# Patient Record
Sex: Male | Born: 1960
Health system: Southern US, Community
[De-identification: ages and names within clinical notes are randomized; demographics above are authoritative.]

## PROBLEM LIST (undated history)

## (undated) DIAGNOSIS — K219 Gastro-esophageal reflux disease without esophagitis: Secondary | ICD-10-CM

## (undated) DIAGNOSIS — M1990 Unspecified osteoarthritis, unspecified site: Secondary | ICD-10-CM

## (undated) DIAGNOSIS — Z9889 Other specified postprocedural states: Secondary | ICD-10-CM

## (undated) DIAGNOSIS — I10 Essential (primary) hypertension: Secondary | ICD-10-CM

## (undated) DIAGNOSIS — R112 Nausea with vomiting, unspecified: Secondary | ICD-10-CM

## (undated) DIAGNOSIS — R002 Palpitations: Secondary | ICD-10-CM

## (undated) HISTORY — DX: Unspecified osteoarthritis, unspecified site: M19.90

---

## 1982-11-19 HISTORY — PX: PILONIDAL CYST EXCISION: SHX744

## 2001-02-28 ENCOUNTER — Emergency Department (HOSPITAL_COMMUNITY): Admission: EM | Admit: 2001-02-28 | Discharge: 2001-02-28 | Payer: Self-pay | Admitting: Emergency Medicine

## 2001-02-28 ENCOUNTER — Encounter: Payer: Self-pay | Admitting: Emergency Medicine

## 2001-03-11 ENCOUNTER — Ambulatory Visit (HOSPITAL_COMMUNITY): Admission: RE | Admit: 2001-03-11 | Discharge: 2001-03-11 | Payer: Self-pay | Admitting: Gastroenterology

## 2007-05-29 ENCOUNTER — Encounter: Admission: RE | Admit: 2007-05-29 | Discharge: 2007-05-29 | Payer: Self-pay | Admitting: Otolaryngology

## 2010-03-19 HISTORY — PX: SHOULDER ARTHROSCOPY: SHX128

## 2010-03-19 HISTORY — PX: CYSTECTOMY: SUR359

## 2010-08-17 ENCOUNTER — Encounter: Admission: RE | Admit: 2010-08-17 | Discharge: 2010-08-17 | Payer: Self-pay | Admitting: Specialist

## 2010-08-17 ENCOUNTER — Emergency Department (HOSPITAL_COMMUNITY): Admission: EM | Admit: 2010-08-17 | Discharge: 2010-08-17 | Payer: Self-pay | Admitting: Emergency Medicine

## 2011-04-06 NOTE — Consult Note (Signed)
[place]. [facility]  Patient:    Alan Estrada, Alan Estrada                        MRN: [id number] Adm. Date:  [id number] Attending:  Doug Sou                          Consultation Report  HISTORY OF PRESENT ILLNESS:  Alan Estrada is a 50 year old gentleman who has been in previously good health.  He presents to the emergency room for palpitations.  The patient has been in relatively good health.  For the past year he has had intermittent episodes of palpitations.  These palpitations have worsened over the past several days and he was seen in [facility].  He was scheduled to see me in the office but presented to the emergency room last night for worsening palpitations.  These palpitations are described as single, isolated heart beat irregularities.  He typically will have many of them in a row and have between 15 and 30 minutes of palpitations.  He denies any episodes of chest pain, syncope, or presyncope with these episodes.  He denies any shortness of breath. These episodes typically occur when he is at rest.  They usually do not occur when he is busy and when he is active.  He has never had any problems doing his usual daily activities and he has not had any problems sleeping.  He has not changed his diet recently.  He has not had any heat or cold intolerance.  He has not had any diarrhea, excessive sweating.  He denies any syncope or presyncope, PND, or orthopnea.  He has not had any episodes of chest pain.  CURRENT MEDICATIONS:  None.  ALLERGIES:  None.  PAST MEDICAL HISTORY:  History of heme positive stool - seen by Alan Estrada.  SOCIAL HISTORY:  The patient drinks alcohol only rarely.  He does not smoke. 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  He does not get any regular exercise but stays fairly busy.  FAMILY HISTORY:  His father died of a ruptured abdominal aortic aneurysm.  His father was a heavy smoker.  REVIEW OF SYSTEMS:  Was reviewed.  It is  essentially negative except for as noted in the HPI.  PHYSICAL EXAMINATION:  GENERAL:  He is a young male in no acute distress.  He is alert and oriented x 3 and his mood and affect are normal.  VITAL SIGNS:  Blood pressure 116/92 with a heart rate of 90.  His respirations are 18.  HEENT:  Reveals 2+ carotids with no bruits.  There is no JVD.  There is no thyromegaly.  His neck is supple.  LUNGS:  Clear to auscultation.  BACK:  Nontender.  HEART:  Regular rate, S1, S2.  He has occasional premature beats.  There are no murmurs, gallops, or rubs.  His PMI is non-displaced.  ABDOMEN:  Reveals good bowel sounds.  He has no hepatosplenomegaly, no masses or bruits.  His abdomen is nontender.  EXTREMITIES:  He has no clubbing, cyanosis, or edema.  His pulses are intact. He has no calf tenderness.  NEUROLOGIC:  Reveals Cranial nerves 2-12 are intact and his motor and sensory function are intact.  His gait was not assessed.  GENITOURINARY:  Deferred.  LABORATORY DATA:  His EKG reveals normal sinus rhythm with rare premature ventricular beats.  Potassium 3.5.  Otherwise the laboratory data is  within normal limits.  IMPRESSION:  Alan Estrada presents with palpitations and is found to have PVCs. I suspect that his PVCs are due to excessive caffeine intact, a low potassium, and perhaps some sleep deficiencies.  We had a long discussion and I have asked him to drink or eat more potassium-containing foods, to try to get more sleep, and to try to reduce his caffeine intake.  I have reassured him that these PVCs are benign and should not cause him any significant problems.  He will see me in the office in a week or so for a follow-up visit.  We discussed the possibility of starting Inderal on an as-needed basis.  He stated he would just like to try the above-noted measures and did not want to start any new medications.  We will have a TSH added to his blood work. DD:  02/28/01 TD:   02/28/01 Job: [id number]

## 2011-04-06 NOTE — Procedures (Signed)
[place]. [facility]  Patient:    Alan Estrada, Alan Estrada                        MRN: [id number] Proc. Date: 03/12/01 Adm. Date:  [id number] Attending:  Charna Elizabeth CC:         Gabriel Earing, M.D.   Procedure Report  DATE OF BIRTH:  July 16, 1960  REFERRING PHYSICIAN:  Gabriel Earing, M.D.  PROCEDURE PERFORMED:  Colonoscopy.  ENDOSCOPIST:  Anselmo Rod, M.D.  INSTRUMENT USED:  Olympus video colonoscope.  INDICATIONS FOR PROCEDURE:  Rectal bleeding in a 50 year old white male rule out colonic polyps, masses, hemorrhoids, etc.  PREPROCEDURE PREPARATION:  Informed consent was procured from the patient. The patient was fasted for eight hours prior to the procedure and prepped with a bottle of magnesium citrate and a gallon of NuLytely the night prior to the procedure.  PREPROCEDURE PHYSICAL:  The patient had stable vital signs.  Neck supple. Chest clear to auscultation.  S1, S2 regular.  Abdomen soft with normal abdominal bowel sounds.  DESCRIPTION OF PROCEDURE:  The patient was placed in the left lateral decubitus position and sedated with 100 mg of Demerol and 10 mg of Versed intravenously.  Once the patient was adequately sedated and maintained on low-flow oxygen and continuous cardiac monitoring, the Olympus video colonoscope was advanced from the rectum to the cecum without difficulty. Except for small internal and external hemorrhoids, no other abnormalities were seen.  No masses, polyps, erosions, ulcerations or diverticula were present.  The procedure was complete up to the terminal ileum.  IMPRESSION:  Small internal and external hemorrhoids.  Otherwise normal-appearing colon.  RECOMMENDATIONS: 1. The patient has been advised to increase the fluid and fiber in the diet. 2. Outpatient follow-up is advised in the next two weeks.  Further    recommendation will be made at that time.DD:  03/12/01 TD:  03/12/01 Job: [id number]

## 2011-08-08 ENCOUNTER — Ambulatory Visit (INDEPENDENT_AMBULATORY_CARE_PROVIDER_SITE_OTHER): Payer: 59 | Admitting: Surgery

## 2011-08-08 ENCOUNTER — Encounter (INDEPENDENT_AMBULATORY_CARE_PROVIDER_SITE_OTHER): Payer: Self-pay | Admitting: Surgery

## 2011-08-08 VITALS — BP 130/90 | HR 64 | Temp 96.4°F | Resp 16 | Ht 75.0 in | Wt 259.6 lb

## 2011-08-08 DIAGNOSIS — L0591 Pilonidal cyst without abscess: Secondary | ICD-10-CM | POA: Insufficient documentation

## 2011-08-08 NOTE — Progress Notes (Signed)
NAME: Alan Estrada                                                                                      DOB: September 30, 1961 DATE: 08/08/2011               MRN: [id number]   CC:  Chief Complaint  Patient presents with  . Other    urgent- eval pilo cyst    HPI:  Alan Estrada is a 50 y.o.  male who presents with A pilonidal. He comes from his primary physician Dr.Brandon at [facility] on [address] Many years ago, when he was in the [employer], he had a pilonidal, and from his description it was operated on but was packed open to heal up. Is not clear whether he had an excision or simply had a drainage of a pilonidal abscess.  He has been having intermittent problems with drainage. It apparently has been draining spontaneously. He notes it develops a little bit of pressure and any drains purulent material. He has been treated a couple times with antibiotics. He is tired of this and wishes to consider having surgical intervention.  PMH:   has a past medical history of Arthritis.  PSH:   has past surgical history that includes Shoulder arthroscopy (03/2010); Cystectomy (03/2010); and Pilonidal cyst excision (1984).  ALLERGIES:  No Known Allergies  MEDICATIONS:   Current Outpatient Prescriptions  Medication Sig Dispense Refill  . Bioflavonoid Products (BIOFLEX PO) Take by mouth daily.        . IBUPROFEN PO Take by mouth as needed.        . Multiple Vitamin (MULTIVITAMIN) capsule Take 1 capsule by mouth daily.          ROS: His ROS is negative except for history of some hearing loss and ringing in his right ear and a history of palpitations which developed when he was taking some ephedrine to try to lose weight.  EXAM:   Gen.: The patient is alert oriented and generally healthy-appearing Back: In the lower midline in the pilonidal area is what appears to be a pair of pilonidal sinus tract openings in the midline. Above this and just to the left is a 1 cm slightly red area  that apparently is where these have been intermittently draining. There is no evidence of active infection today.  DATA REVIEWED:  I have reviewed the notes from his primary care physician IMPRESSION:  Pilonidal cyst and sinus  PLAN:   I've recommended that we do an elective excision of this area. I told him that all infection needs to be settle down so I think he needs to finish the course of antibiotics recently prescribed for him. I told him we would try to do this in about three weeks.  I told the plan would be to excise this area and try to do a primary closure but occasionally these areas had to be left open to heal from "inside out." He would like to go ahead and schedule surgery.

## 2011-08-08 NOTE — Patient Instructions (Signed)
We will schedule you for surgery to have the pilonidal area removed. This be done as an outpatient. You will have sutures in and will need to return to have those removed.  Call my office if you have any questions or concerns.  It is important that you finish the antibiotics that were recently prescribed. If you have any active infection at the time of surgery we will not be able to do a primary closure.

## 2011-09-03 ENCOUNTER — Other Ambulatory Visit (INDEPENDENT_AMBULATORY_CARE_PROVIDER_SITE_OTHER): Payer: Self-pay | Admitting: Surgery

## 2011-09-03 ENCOUNTER — Encounter (INDEPENDENT_AMBULATORY_CARE_PROVIDER_SITE_OTHER): Payer: Self-pay | Admitting: Surgery

## 2011-09-03 ENCOUNTER — Ambulatory Visit (HOSPITAL_BASED_OUTPATIENT_CLINIC_OR_DEPARTMENT_OTHER)
Admission: RE | Admit: 2011-09-03 | Discharge: 2011-09-03 | Disposition: A | Discharge status: Home / Self Care | Payer: 59 | Source: Ambulatory Visit | Attending: Surgery | Admitting: Surgery

## 2011-09-03 DIAGNOSIS — L0591 Pilonidal cyst without abscess: Secondary | ICD-10-CM

## 2011-09-03 DIAGNOSIS — K219 Gastro-esophageal reflux disease without esophagitis: Secondary | ICD-10-CM | POA: Insufficient documentation

## 2011-09-03 DIAGNOSIS — Z01812 Encounter for preprocedural laboratory examination: Secondary | ICD-10-CM | POA: Insufficient documentation

## 2011-09-03 HISTORY — PX: PILONIDAL CYST EXCISION: SHX744

## 2011-09-04 NOTE — Op Note (Signed)
  NAMESTEPEHN, ECKARD NO.:  [id number]  MEDICAL RECORD NO.:  [id number]  LOCATION:                                 FACILITY:  PHYSICIAN:  Currie Paris, M.D.   DATE OF BIRTH:  DATE OF PROCEDURE:  09/03/2011 DATE OF DISCHARGE:                              OPERATIVE REPORT   PREOPERATIVE DIAGNOSIS:  Pilonidal cyst/sinus.  POSTOPERATIVE DIAGNOSIS:  Pilonidal cyst/sinus.  PROCEDURE:  Excision pilonidal cyst and sinus.  SURGEON:  Currie Paris, MD  ASSISTANT:  Jaclynn Major, PA student.  ANESTHESIA:  General.  CLINICAL HISTORY:  This is a 50 year old gentleman who has had a chronic pilonidal with intermittent episodes of inflammation.  He had it either excised or drained many years ago when he was in [employer], but has had recurrence.  He elected at this point to proceed to excision.  DESCRIPTION OF PROCEDURE:  I saw the patient in the preoperative area and he had no further questions.  We confirmed the plans for the procedure as noted above with risk complications had been noted previously.  He had no further questions.  We noted that we may be able to close this primarily or might have to be left open to heal by secondary intention.  The patient was then taken to the operating room, and after satisfactory general anesthesia, he was placed in the operating room table in the prone position with appropriate padding.  The area of the pilonidal was clipped, prepped, and draped.  The time-out was done.  I injected the pilonidal with little methylene blue to try to make sure we did not leave any of the tract behind.  I made an elliptical incision around it, deepened it to the fascia, and did a full-thickness excision of skin subcutaneous tissue and fascia.  We did not see that we had crossed any blue dye in any place.  I elevated the full-thickness off of the fascia little bit to take some of the tension off the closure.  I put 20 mL of  Marcaine in to help with postop pain relief.  I have used some Vicryl to close the fascia and then a combination some 2-0 nylon and 4-0 Prolene vertical mattresses to close the skin so that we had closed approximation.  Everything appeared to be dry.  I held about 5 minutes of pressure just to be sure there will be no bleeding and then put some Dermabond on the incision. The patient tolerated the procedure well.  There were no complications. All counts were correct.     Currie Paris, M.D.     CJS/MEDQ  D:  09/03/2011  T:  09/03/2011  Job:  [id number]  cc:   Dr. Christella Hartigan  Electronically Signed by Cyndia Bent M.D. on 09/04/2011 05:37:45 AM

## 2011-09-11 ENCOUNTER — Encounter (INDEPENDENT_AMBULATORY_CARE_PROVIDER_SITE_OTHER): Payer: Self-pay | Admitting: Surgery

## 2011-09-11 ENCOUNTER — Ambulatory Visit (INDEPENDENT_AMBULATORY_CARE_PROVIDER_SITE_OTHER): Payer: 59 | Admitting: Surgery

## 2011-09-11 VITALS — BP 118/92 | HR 64 | Temp 97.5°F | Resp 20 | Ht 75.0 in | Wt 255.1 lb

## 2011-09-11 DIAGNOSIS — L0591 Pilonidal cyst without abscess: Secondary | ICD-10-CM

## 2011-09-11 NOTE — Patient Instructions (Signed)
I will plan to see you back next week. Call if there are any problems

## 2011-09-11 NOTE — Progress Notes (Signed)
  CC: Followup after her pilonidal excision with primary closure  HPI: This patient comes in for post op follow-up. He underwent excision of pilonidal on 09/03/11. He feels that he is doing well.  PE: General: The patient appears to be healthy,  Surgical site: Seems to be healing nicely with no evidence of infection. I removed a couple of the sutures but left the remaining once behind.  DATA REVIEWED: Pathology report was noted and discussed with the patient   IMPRESSION: The patient is doing well S/P pilonidal excision with primary closure.    PLAN: Recheck next week for likely suture removal

## 2011-09-20 ENCOUNTER — Encounter (INDEPENDENT_AMBULATORY_CARE_PROVIDER_SITE_OTHER): Payer: Self-pay | Admitting: Surgery

## 2011-09-20 ENCOUNTER — Ambulatory Visit (INDEPENDENT_AMBULATORY_CARE_PROVIDER_SITE_OTHER): Payer: 59 | Admitting: Surgery

## 2011-09-20 VITALS — BP 136/90 | HR 84 | Temp 97.4°F | Resp 16 | Ht 75.0 in | Wt 258.0 lb

## 2011-09-20 DIAGNOSIS — L0591 Pilonidal cyst without abscess: Secondary | ICD-10-CM

## 2011-09-20 NOTE — Patient Instructions (Signed)
You may return to work on Monday. I will be seen next week to get the remaining sutures out. Call if there are any problems in the meantime.

## 2011-09-20 NOTE — Progress Notes (Signed)
  CC: Followup after her pilonidal excision with primary closure  HPI: This patient comes in for post op follow-up. He underwent excision of pilonidal on 09/03/11. He feels that he is doing well.He had some bloody drainage after we removed some sutures last week  PE: General: The patient appears to be healthy,  Surgical site: Seems to be healing nicely with no evidence of infection. I removed a most of the sutures but left a few in the mid portion.    IMPRESSION: The patient is doing well S/P pilonidal excision with primary closure.    PLAN: Recheck next week for suture removal

## 2011-09-28 ENCOUNTER — Encounter (INDEPENDENT_AMBULATORY_CARE_PROVIDER_SITE_OTHER): Payer: Self-pay | Admitting: Surgery

## 2011-09-28 ENCOUNTER — Ambulatory Visit (INDEPENDENT_AMBULATORY_CARE_PROVIDER_SITE_OTHER): Payer: 59 | Admitting: Surgery

## 2011-09-28 VITALS — BP 116/82 | HR 80 | Temp 97.9°F | Resp 20 | Ht 75.0 in | Wt 206.0 lb

## 2011-09-28 DIAGNOSIS — Z9889 Other specified postprocedural states: Secondary | ICD-10-CM

## 2011-09-28 NOTE — Progress Notes (Signed)
  CC: Followup after his pilonidal excision with primary closure  HPI: This patient comes in for post op follow-up. He underwent excision of pilonidal on 09/03/11. He feels that he is doing well.  PE: General: The patient appears to be healthy,  Surgical site: Seems to be healing nicely with no evidence of infection. I removed the final sutures. No evidence of infection, appears completely healed    IMPRESSION: The patient is doing well S/P pilonidal excision with primary closure.    PLAN: RTC PRN

## 2011-11-20 HISTORY — PX: CARPAL TUNNEL RELEASE: SHX101

## 2012-01-01 ENCOUNTER — Other Ambulatory Visit: Payer: Self-pay | Admitting: Neurosurgery

## 2012-01-01 DIAGNOSIS — M545 Low back pain: Secondary | ICD-10-CM

## 2012-01-05 ENCOUNTER — Ambulatory Visit
Admission: RE | Admit: 2012-01-05 | Discharge: 2012-01-05 | Disposition: A | Discharge status: Home / Self Care | Payer: 59 | Source: Ambulatory Visit | Attending: Neurosurgery | Admitting: Neurosurgery

## 2012-01-05 DIAGNOSIS — M545 Low back pain: Secondary | ICD-10-CM

## 2012-11-19 HISTORY — PX: ELBOW ARTHROSCOPY: SUR87

## 2013-08-19 DIAGNOSIS — R002 Palpitations: Secondary | ICD-10-CM

## 2013-08-19 HISTORY — DX: Palpitations: R00.2

## 2014-06-12 ENCOUNTER — Emergency Department (HOSPITAL_COMMUNITY)
Admission: EM | Admit: 2014-06-12 | Discharge: 2014-06-12 | Disposition: A | Discharge status: Home / Self Care | Payer: 59 | Attending: Emergency Medicine | Admitting: Emergency Medicine

## 2014-06-12 ENCOUNTER — Encounter (HOSPITAL_COMMUNITY): Payer: Self-pay | Admitting: Emergency Medicine

## 2014-06-12 DIAGNOSIS — Z8739 Personal history of other diseases of the musculoskeletal system and connective tissue: Secondary | ICD-10-CM | POA: Insufficient documentation

## 2014-06-12 DIAGNOSIS — Z79899 Other long term (current) drug therapy: Secondary | ICD-10-CM | POA: Diagnosis not present

## 2014-06-12 DIAGNOSIS — Z791 Long term (current) use of non-steroidal anti-inflammatories (NSAID): Secondary | ICD-10-CM | POA: Diagnosis not present

## 2014-06-12 DIAGNOSIS — R110 Nausea: Secondary | ICD-10-CM | POA: Diagnosis not present

## 2014-06-12 DIAGNOSIS — R1032 Left lower quadrant pain: Secondary | ICD-10-CM | POA: Insufficient documentation

## 2014-06-12 DIAGNOSIS — R319 Hematuria, unspecified: Secondary | ICD-10-CM | POA: Insufficient documentation

## 2014-06-12 DIAGNOSIS — R6883 Chills (without fever): Secondary | ICD-10-CM | POA: Diagnosis not present

## 2014-06-12 DIAGNOSIS — Z906 Acquired absence of other parts of urinary tract: Secondary | ICD-10-CM | POA: Diagnosis not present

## 2014-06-12 LAB — CBC WITH DIFFERENTIAL/PLATELET
BASOS ABS: 0 10*3/uL (ref 0.0–0.1)
BASOS PCT: 1 % (ref 0–1)
Eosinophils Absolute: 0.3 10*3/uL (ref 0.0–0.7)
Eosinophils Relative: 7 % — ABNORMAL HIGH (ref 0–5)
HCT: 42.5 % (ref 39.0–52.0)
HEMOGLOBIN: 15.1 g/dL (ref 13.0–17.0)
LYMPHS PCT: 33 % (ref 12–46)
Lymphs Abs: 1.3 10*3/uL (ref 0.7–4.0)
MCH: 30.8 pg (ref 26.0–34.0)
MCHC: 35.5 g/dL (ref 30.0–36.0)
MCV: 86.6 fL (ref 78.0–100.0)
MONOS PCT: 6 % (ref 3–12)
Monocytes Absolute: 0.2 10*3/uL (ref 0.1–1.0)
NEUTROS ABS: 2.1 10*3/uL (ref 1.7–7.7)
NEUTROS PCT: 53 % (ref 43–77)
Platelets: 153 10*3/uL (ref 150–400)
RBC: 4.91 MIL/uL (ref 4.22–5.81)
RDW: 12.4 % (ref 11.5–15.5)
WBC: 4 10*3/uL (ref 4.0–10.5)

## 2014-06-12 LAB — COMPREHENSIVE METABOLIC PANEL
ALBUMIN: 4 g/dL (ref 3.5–5.2)
ALK PHOS: 87 U/L (ref 39–117)
ALT: 28 U/L (ref 0–53)
AST: 24 U/L (ref 0–37)
Anion gap: 16 — ABNORMAL HIGH (ref 5–15)
BILIRUBIN TOTAL: 0.5 mg/dL (ref 0.3–1.2)
BUN: 20 mg/dL (ref 6–23)
CHLORIDE: 104 meq/L (ref 96–112)
CO2: 23 mEq/L (ref 19–32)
Calcium: 8.9 mg/dL (ref 8.4–10.5)
Creatinine, Ser: 0.73 mg/dL (ref 0.50–1.35)
GFR calc Af Amer: >90 mL/min (ref >90)
GFR calc non Af Amer: >90 mL/min (ref >90)
Glucose, Bld: 120 mg/dL — ABNORMAL HIGH (ref 70–99)
POTASSIUM: 3.7 meq/L (ref 3.7–5.3)
Sodium: 143 mEq/L (ref 137–147)
Total Protein: 6.9 g/dL (ref 6.0–8.3)

## 2014-06-12 LAB — URINALYSIS, ROUTINE W REFLEX MICROSCOPIC
Bilirubin Urine: NEGATIVE
Glucose, UA: NEGATIVE mg/dL
KETONES UR: NEGATIVE mg/dL
LEUKOCYTES UA: NEGATIVE
NITRITE: NEGATIVE
PH: 6 (ref 5.0–8.0)
Protein, ur: NEGATIVE mg/dL
SPECIFIC GRAVITY, URINE: 1.013 (ref 1.005–1.030)
Urobilinogen, UA: 0.2 mg/dL (ref 0.0–1.0)

## 2014-06-12 LAB — URINE MICROSCOPIC-ADD ON

## 2014-06-12 MED ORDER — HYDROCODONE-ACETAMINOPHEN 5-325 MG PO TABS: 1.0000 | ORAL_TABLET | ORAL | Status: DC | PRN

## 2014-06-12 NOTE — ED Notes (Signed)
Pt given water to drink. 

## 2014-06-12 NOTE — ED Notes (Signed)
Dr. Wickline at bedside.  

## 2014-06-12 NOTE — ED Provider Notes (Signed)
CSN: [id number]     Arrival date & time 06/12/14  4098 History   First MD Initiated Contact with Patient 06/12/14 [phone]     Chief Complaint  Patient presents with  . Groin Pain     Patient is a 53 y.o. male presenting with abdominal pain. The history is provided by the patient and a significant other.  Abdominal Pain Pain location:  LLQ Pain quality: sharp   Pain severity:  Moderate Onset quality:  Sudden Duration:  2 hours Timing:  Constant Progression:  Improving Chronicity:  New Context: awakening from sleep   Relieved by:  None tried Worsened by:  Nothing tried Associated symptoms: chills and nausea   Associated symptoms: no chest pain, no dysuria, no fever, no shortness of breath and no vomiting   Pt reports after waking up this morning he had pain in LLQ that radiated into left upper abdomen/flank  He has never had this before He had otherwise been well He is now improving He denies known h/o kidney stones  Past Medical History  Diagnosis Date  . Arthritis    Past Surgical History  Procedure Laterality Date  . Shoulder arthroscopy  03/2010    right  . Cystectomy  03/2010    from inside right shoulder  . Pilonidal cyst excision  1984  . Pilonidal cyst excision  09/03/2011    Dr Jamey Ripa   No family history on file. History  Substance Use Topics  . Smoking status: Never Smoker   . Smokeless tobacco: Never Used  . Alcohol Use: Yes    Review of Systems  Constitutional: Positive for chills. Negative for fever.  Respiratory: Negative for shortness of breath.   Cardiovascular: Negative for chest pain.  Gastrointestinal: Positive for nausea and abdominal pain. Negative for vomiting.  Genitourinary: Negative for dysuria and testicular pain.  Neurological: Negative for weakness.  All other systems reviewed and are negative.     Allergies  Review of patient's allergies indicates no known allergies.  Home Medications   Prior to Admission medications    Medication Sig Start Date End Date Taking? Authorizing Provider  DiphenhydrAMINE HCl (ALLERGY MED PO) Take 1 tablet by mouth daily.   Yes Historical Provider, MD  Glucosamine-Chondroitin (GLUCOSAMINE CHONDR COMPLEX PO) Take 1 oz by mouth daily.   Yes Historical Provider, MD  Multiple Vitamin (MULTIVITAMIN) capsule Take 1 capsule by mouth daily.     Yes Historical Provider, MD  naproxen sodium (ANAPROX) 220 MG tablet Take 400 mg by mouth every morning.   Yes Historical Provider, MD  Omega-3 Fatty Acids (FISH OIL PO) Take 1 capsule by mouth daily.   Yes Historical Provider, MD   BP 120/89  Pulse 62  Temp(Src) 97.6 F (36.4 C) (Oral)  Resp 11  Ht 6\' 3"  (1.905 m)  Wt 255 lb (115.667 kg)  BMI 31.87 kg/m2  SpO2 93% Physical Exam CONSTITUTIONAL: Well developed/well nourished HEAD: Normocephalic/atraumatic EYES: EOMI/PERRL ENMT: Mucous membranes moist NECK: supple no meningeal signs SPINE:entire spine nontender CV: S1/S2 noted, no murmurs/rubs/gallops noted LUNGS: Lungs are clear to auscultation bilaterally, no apparent distress ABDOMEN: soft, nontender, no rebound or guarding GU:no cva tenderness. No tenderness to inguinal region NEURO: Pt is awake/alert, moves all extremitiesx4 EXTREMITIES: pulses normal, full ROM SKIN: warm, color normal PSYCH: no abnormalities of mood noted  ED Course  Procedures   8:02 AM Pt with sudden onset of LLQ pain, now improving He is well appearing Will check urinalysis and reassess He has no groin/testicular pain  at this time Suspect ureteral colic  Pt improved He is in no distress  given history of sharp pain with hematuria that is now resolving, suspect ureteral colic I doubt AAA/diverticulitis or other acute abdominal/vascular process I had long discussion with patient/family.  I did offer imaging to evaluate for stones.  After discussion, pt prefers to go home and f/u as outpatient.  I think this is reasonable.  He was given pain meds and we  discussed strict return precautions  Labs Review Labs Reviewed  URINALYSIS, ROUTINE W REFLEX MICROSCOPIC - Abnormal; Notable for the following:    APPearance CLOUDY (*)    Hgb urine dipstick LARGE (*)    All other components within normal limits  CBC WITH DIFFERENTIAL - Abnormal; Notable for the following:    Eosinophils Relative 7 (*)    All other components within normal limits  COMPREHENSIVE METABOLIC PANEL - Abnormal; Notable for the following:    Glucose, Bld 120 (*)    Anion gap 16 (*)    All other components within normal limits  URINE MICROSCOPIC-ADD ON     MDM   Final diagnoses:  Left lower quadrant pain  Hematuria    Nursing notes including past medical history and social history reviewed and considered in documentation Labs/vital reviewed and considered     Joya Gaskinsonald W Jennise Both, MD 06/12/14 1009

## 2014-06-12 NOTE — ED Notes (Signed)
Pt. reports left groin pain radiating to LLQ onset this morning , denies injury , no urinary discomfort , denies nausea or vomitting , no fever.

## 2014-06-12 NOTE — ED Notes (Signed)
VSS stable. NAD noted. Pt c/o 1/10 pain. Pt given discharge instructions and prescriptions were reviewed. Al questions answered. Pt ambulatory on discharge

## 2014-12-24 ENCOUNTER — Emergency Department (HOSPITAL_COMMUNITY): Payer: Worker's Compensation

## 2014-12-24 ENCOUNTER — Inpatient Hospital Stay (HOSPITAL_COMMUNITY)
Admission: EM | Admit: 2014-12-24 | Discharge: 2014-12-26 | DRG: 087 | Disposition: A | Discharge status: Home / Self Care | Payer: Worker's Compensation | Attending: General Surgery | Admitting: General Surgery

## 2014-12-24 ENCOUNTER — Encounter (HOSPITAL_COMMUNITY): Payer: Self-pay | Admitting: Emergency Medicine

## 2014-12-24 DIAGNOSIS — S020XXA Fracture of vault of skull, initial encounter for closed fracture: Principal | ICD-10-CM | POA: Diagnosis present

## 2014-12-24 DIAGNOSIS — S02401A Maxillary fracture, unspecified, initial encounter for closed fracture: Secondary | ICD-10-CM | POA: Insufficient documentation

## 2014-12-24 DIAGNOSIS — S06890A Other specified intracranial injury without loss of consciousness, initial encounter: Secondary | ICD-10-CM | POA: Diagnosis present

## 2014-12-24 DIAGNOSIS — W208XXA Other cause of strike by thrown, projected or falling object, initial encounter: Secondary | ICD-10-CM | POA: Diagnosis present

## 2014-12-24 DIAGNOSIS — S0292XA Unspecified fracture of facial bones, initial encounter for closed fracture: Secondary | ICD-10-CM | POA: Diagnosis present

## 2014-12-24 DIAGNOSIS — G9389 Other specified disorders of brain: Secondary | ICD-10-CM | POA: Diagnosis present

## 2014-12-24 DIAGNOSIS — M1990 Unspecified osteoarthritis, unspecified site: Secondary | ICD-10-CM | POA: Diagnosis present

## 2014-12-24 DIAGNOSIS — S028XXA Fractures of other specified skull and facial bones, initial encounter for closed fracture: Secondary | ICD-10-CM | POA: Diagnosis present

## 2014-12-24 DIAGNOSIS — R002 Palpitations: Secondary | ICD-10-CM | POA: Diagnosis present

## 2014-12-24 DIAGNOSIS — S0219XA Other fracture of base of skull, initial encounter for closed fracture: Secondary | ICD-10-CM

## 2014-12-24 DIAGNOSIS — Y990 Civilian activity done for income or pay: Secondary | ICD-10-CM

## 2014-12-24 DIAGNOSIS — S02413A LeFort III fracture, initial encounter for closed fracture: Secondary | ICD-10-CM | POA: Diagnosis present

## 2014-12-24 DIAGNOSIS — R110 Nausea: Secondary | ICD-10-CM | POA: Diagnosis present

## 2014-12-24 DIAGNOSIS — S02412A LeFort II fracture, initial encounter for closed fracture: Secondary | ICD-10-CM | POA: Diagnosis present

## 2014-12-24 HISTORY — DX: Palpitations: R00.2

## 2014-12-24 LAB — BASIC METABOLIC PANEL
Anion gap: 7 (ref 5–15)
BUN: 10 mg/dL (ref 6–23)
CALCIUM: 9 mg/dL (ref 8.4–10.5)
CO2: 26 mmol/L (ref 19–32)
CREATININE: 0.82 mg/dL (ref 0.50–1.35)
Chloride: 105 mmol/L (ref 96–112)
GFR calc Af Amer: >90 mL/min (ref >90)
GFR calc non Af Amer: >90 mL/min (ref >90)
GLUCOSE: 132 mg/dL — AB (ref 70–99)
POTASSIUM: 3.5 mmol/L (ref 3.5–5.1)
SODIUM: 138 mmol/L (ref 135–145)

## 2014-12-24 LAB — TYPE AND SCREEN
ABO/RH(D): B POS
ANTIBODY SCREEN: NEGATIVE

## 2014-12-24 LAB — CBC WITH DIFFERENTIAL/PLATELET
BASOS ABS: 0 10*3/uL (ref 0.0–0.1)
BASOS PCT: 0 % (ref 0–1)
EOS ABS: 0.1 10*3/uL (ref 0.0–0.7)
Eosinophils Relative: 2 % (ref 0–5)
HCT: 40.1 % (ref 39.0–52.0)
Hemoglobin: 14.3 g/dL (ref 13.0–17.0)
LYMPHS ABS: 1.1 10*3/uL (ref 0.7–4.0)
LYMPHS PCT: 13 % (ref 12–46)
MCH: 30.7 pg (ref 26.0–34.0)
MCHC: 35.7 g/dL (ref 30.0–36.0)
MCV: 86.1 fL (ref 78.0–100.0)
MONO ABS: 0.5 10*3/uL (ref 0.1–1.0)
Monocytes Relative: 6 % (ref 3–12)
NEUTROS ABS: 6.8 10*3/uL (ref 1.7–7.7)
NEUTROS PCT: 79 % — AB (ref 43–77)
PLATELETS: 157 10*3/uL (ref 150–400)
RBC: 4.66 MIL/uL (ref 4.22–5.81)
RDW: 12.9 % (ref 11.5–15.5)
WBC: 8.6 10*3/uL (ref 4.0–10.5)

## 2014-12-24 LAB — ABO/RH: ABO/RH(D): B POS

## 2014-12-24 LAB — ETHANOL: Alcohol, Ethyl (B): <5 mg/dL (ref 0–9)

## 2014-12-24 MED ORDER — PANTOPRAZOLE SODIUM 40 MG PO TBEC
40.0000 mg | DELAYED_RELEASE_TABLET | Freq: Every day | ORAL | Status: DC
  Administered 2014-12-25 – 2014-12-26 (×2): 40 mg via ORAL
  Filled 2014-12-24 (×3): qty 1

## 2014-12-24 MED ORDER — ONDANSETRON HCL 4 MG/2ML IJ SOLN
4.0000 mg | INTRAMUSCULAR | Status: DC
  Administered 2014-12-25: 4 mg via INTRAVENOUS
  Filled 2014-12-24: qty 2

## 2014-12-24 MED ORDER — ONDANSETRON HCL 4 MG/2ML IJ SOLN
4.0000 mg | Freq: Once | INTRAMUSCULAR | Status: AC
  Administered 2014-12-24: 4 mg via INTRAVENOUS

## 2014-12-24 MED ORDER — PROMETHAZINE HCL 25 MG/ML IJ SOLN
12.5000 mg | Freq: Once | INTRAMUSCULAR | Status: AC
  Administered 2014-12-24: 12.5 mg via INTRAVENOUS
  Filled 2014-12-24: qty 1

## 2014-12-24 MED ORDER — ONDANSETRON HCL 4 MG PO TABS: 4.0000 mg | ORAL_TABLET | ORAL | Status: DC

## 2014-12-24 MED ORDER — ONDANSETRON HCL 4 MG/2ML IJ SOLN: 4.0000 mg | INTRAMUSCULAR | Status: DC

## 2014-12-24 MED ORDER — DOCUSATE SODIUM 100 MG PO CAPS
100.0000 mg | ORAL_CAPSULE | Freq: Two times a day (BID) | ORAL | Status: DC
  Administered 2014-12-24 – 2014-12-26 (×5): 100 mg via ORAL
  Filled 2014-12-24 (×5): qty 1

## 2014-12-24 MED ORDER — ONDANSETRON HCL 4 MG/2ML IJ SOLN: 4.0000 mg | Freq: Once | INTRAMUSCULAR | Status: DC

## 2014-12-24 MED ORDER — ONDANSETRON HCL 4 MG PO TABS
4.0000 mg | ORAL_TABLET | ORAL | Status: DC
  Administered 2014-12-24 – 2014-12-26 (×6): 4 mg via ORAL
  Filled 2014-12-24 (×16): qty 1

## 2014-12-24 MED ORDER — ONDANSETRON HCL 4 MG/2ML IJ SOLN
4.0000 mg | Freq: Once | INTRAMUSCULAR | Status: AC
  Administered 2014-12-24: 4 mg via INTRAVENOUS
  Filled 2014-12-24: qty 2

## 2014-12-24 MED ORDER — CHLORPHENIRAMINE MALEATE 4 MG PO TABS: 4.0000 mg | ORAL_TABLET | Freq: Four times a day (QID) | ORAL | Status: DC | PRN

## 2014-12-24 MED ORDER — HYDROMORPHONE HCL 1 MG/ML IJ SOLN
1.0000 mg | Freq: Once | INTRAMUSCULAR | Status: AC
  Administered 2014-12-24: 1 mg via INTRAVENOUS
  Filled 2014-12-24: qty 1

## 2014-12-24 MED ORDER — MORPHINE SULFATE 2 MG/ML IJ SOLN
2.0000 mg | INTRAMUSCULAR | Status: DC | PRN
  Administered 2014-12-24 (×3): 2 mg via INTRAVENOUS
  Filled 2014-12-24 (×3): qty 1

## 2014-12-24 MED ORDER — DIPHENHYDRAMINE HCL 25 MG PO CAPS
25.0000 mg | ORAL_CAPSULE | Freq: Four times a day (QID) | ORAL | Status: DC | PRN
  Administered 2014-12-25 – 2014-12-26 (×4): 25 mg via ORAL
  Filled 2014-12-24 (×4): qty 1

## 2014-12-24 MED ORDER — POTASSIUM CHLORIDE IN NACL 20-0.45 MEQ/L-% IV SOLN
INTRAVENOUS | Status: DC
  Administered 2014-12-24 – 2014-12-25 (×2): via INTRAVENOUS
  Administered 2014-12-26: 1000 mL via INTRAVENOUS
  Filled 2014-12-24 (×4): qty 1000

## 2014-12-24 MED ORDER — HYDROCODONE-ACETAMINOPHEN 10-325 MG PO TABS
0.5000 | ORAL_TABLET | ORAL | Status: DC | PRN
  Administered 2014-12-24: 1 via ORAL
  Administered 2014-12-24 – 2014-12-26 (×11): 2 via ORAL
  Filled 2014-12-24: qty 1
  Filled 2014-12-24 (×12): qty 2

## 2014-12-24 MED ORDER — PROMETHAZINE HCL 25 MG/ML IJ SOLN
12.5000 mg | INTRAMUSCULAR | Status: DC | PRN
  Administered 2014-12-26: 12.5 mg via INTRAVENOUS
  Filled 2014-12-24 (×2): qty 1

## 2014-12-24 MED ORDER — ENOXAPARIN SODIUM 40 MG/0.4ML ~~LOC~~ SOLN
40.0000 mg | SUBCUTANEOUS | Status: DC
  Administered 2014-12-24 – 2014-12-26 (×3): 40 mg via SUBCUTANEOUS
  Filled 2014-12-24 (×5): qty 0.4

## 2014-12-24 MED ORDER — FENTANYL CITRATE 0.05 MG/ML IJ SOLN
100.0000 ug | Freq: Once | INTRAMUSCULAR | Status: AC
  Administered 2014-12-24: 100 ug via INTRAVENOUS
  Filled 2014-12-24: qty 2

## 2014-12-24 MED ORDER — PANTOPRAZOLE SODIUM 40 MG IV SOLR
40.0000 mg | Freq: Every day | INTRAVENOUS | Status: DC
  Administered 2014-12-24: 40 mg via INTRAVENOUS
  Filled 2014-12-24 (×3): qty 40
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
## 2014-12-24 NOTE — ED Notes (Signed)
PT vomitted approx of blood. States that he is still feeling nauseated. Pt rt nare noted to have some new blood. Gauze applied. Bleeding controled at this time. Trauma PA notified and nausea and pain medications requested.

## 2014-12-24 NOTE — ED Provider Notes (Signed)
CSN: [id number]     Arrival date & time 12/24/14  0450 History   First MD Initiated Contact with Patient 12/24/14 [phone]     Chief Complaint  Patient presents with  . Facial Laceration  . Bleeding/Bruising     Patient is a 54 y.o. male presenting with facial injury. The history is provided by the patient.  Facial Injury Mechanism of injury:  Direct blow Location:  Forehead and nose Time since incident: over 1 hr ago. Pain details:    Severity:  Severe   Timing:  Constant   Progression:  Worsening Chronicity:  New Relieved by: IV fentanyl. Worsened by:  Movement Associated symptoms: epistaxis, headaches and nausea   Associated symptoms: no loss of consciousness and no neck pain   Patient works for [employer] He was working on [employer] and he was cutting tree limbs when one of them hit him in the face No LOC He was ground level No electrocution reported He feels his teeth are not aligned  No neck/back pain No cp/abdominal pain  Tetanus is UTD  Past Medical History  Diagnosis Date  . Arthritis   . Palpitations    Past Surgical History  Procedure Laterality Date  . Shoulder arthroscopy  03/2010    right  . Cystectomy  03/2010    from inside right shoulder  . Pilonidal cyst excision  1984  . Pilonidal cyst excision  09/03/2011    Dr Jamey Ripa   No family history on file. History  Substance Use Topics  . Smoking status: Never Smoker   . Smokeless tobacco: Never Used  . Alcohol Use: Yes    Review of Systems  Constitutional: Negative for fever.  HENT: Positive for nosebleeds.   Cardiovascular: Negative for chest pain.  Gastrointestinal: Positive for nausea.  Musculoskeletal: Negative for back pain and neck pain.  Neurological: Positive for headaches. Negative for loss of consciousness.  All other systems reviewed and are negative.     Allergies  Review of patient's allergies indicates no known allergies.  Home Medications   Prior to Admission  medications   Medication Sig Start Date End Date Taking? Authorizing Provider  HYDROcodone-acetaminophen (NORCO/VICODIN) 5-325 MG per tablet Take 1 tablet by mouth every 4 (four) hours as needed for moderate pain or severe pain. 06/12/14  Yes Joya Gaskins, MD  DiphenhydrAMINE HCl (ALLERGY MED PO) Take 1 tablet by mouth daily.    Historical Provider, MD  Glucosamine-Chondroitin (GLUCOSAMINE CHONDR COMPLEX PO) Take 1 oz by mouth daily.    Historical Provider, MD  Multiple Vitamin (MULTIVITAMIN) capsule Take 1 capsule by mouth daily.      Historical Provider, MD  naproxen sodium (ANAPROX) 220 MG tablet Take 400 mg by mouth every morning.    Historical Provider, MD  Omega-3 Fatty Acids (FISH OIL PO) Take 1 capsule by mouth daily.    Historical Provider, MD   BP 126/80 mmHg  Pulse 60  Temp(Src) 98.2 F (36.8 C) (Oral)  Resp 15  Ht  (1.905 m)  Wt 255 lb (115.667 kg)  BMI 31.87 kg/m2  SpO2 97% Physical Exam CONSTITUTIONAL: Well developed/well nourished HEAD: bruising dried blood to forehead.  No other signs of head trauma EYES: +EOMI, right eyelid is edematous and bruised.  OD - globe appears intact, no erythema, pupil responds to light, no subconjunctival hemorrhage.  OS is without bruising or evidence of trauma ENMT: Mucous membranes moist.  No dental fracture noted.  Dried blood to nose with laceration to  bridge of nose.  No obvious septal hematoma.   NECK: supple no meningeal signs SPINE/BACK:entire spine nontender, No bruising/crepitance/stepoffs noted to spine CV: S1/S2 noted, no murmurs/rubs/gallops noted LUNGS: Lungs are clear to auscultation bilaterally, no apparent distress ABDOMEN: soft, nontender, no rebound or guarding, bowel sounds noted throughout abdomen GU:no cva tenderness NEURO: Pt is awake/alert/appropriate, moves all extremitiesx4.  GCS 15 EXTREMITIES: pulses normal/equal, full ROM, All extremities/joints palpated/ranged and nontender SKIN: warm, color  normal PSYCH: no abnormalities of mood noted, alert and oriented to situation  ED Course  Procedures  CRITICAL CARE Performed by: Joya GaskinsWICKLINE,Vonceil Upshur W Total critical care time: 31 Critical care time was exclusive of separately billable procedures and treating other patients. Critical care was necessary to treat or prevent imminent or life-threatening deterioration. Critical care was time spent personally by me on the following activities: development of treatment plan with patient and/or surrogate as well as nursing, discussions with consultants, evaluation of patient's response to treatment, examination of patient, obtaining history from patient or surrogate, ordering and performing treatments and interventions, ordering and review of laboratory studies, ordering and review of radiographic studies, pulse oximetry and re-evaluation of patient's condition. Patient with significant facial/frontal sinus fracture as well pneumocephalus  5:29 AM CT imaging ordered Pt denies any visual disturbance 6:18 AM D/w radiology Patient has frontal sinus fracture.  He has pneumocephalus He also has lefort facial fractures Will consult trauma/neurosurgery/ENT 6:34 AM Pt is awake/alert, GCS 15.  He is protecting his airway D/w dr Gerlene Feekritzer with nsgy he will review imaging D/w dr Alan Ripperclaire sanger for facial trauma, she will review CT imaging Consult to trauma 6:40 AM D/w dr Janee Mornthompson with trauma, will evaluate patient 7:43 AM After further evaluation, given mechanism will order CT cspine Pt stable in ED Will be admitted to trauma  Imaging Review Ct Head Wo Contrast  12/24/2014   CLINICAL DATA:  Head injury. Hit in face by tree limb. Bruising to both eyes.  EXAM: CT HEAD WITHOUT CONTRAST  CT MAXILLOFACIAL WITHOUT CONTRAST  TECHNIQUE: Multidetector CT imaging of the head and maxillofacial structures were performed using the standard protocol without intravenous contrast. Multiplanar CT image reconstructions of  the maxillofacial structures were also generated.  COMPARISON:  None.  FINDINGS: CT HEAD FINDINGS  There are small foci of pneumocephalus anteriorly related to right frontal bone fracture. This is better characterized on concurrently performed CT face. There is no associated intracranial hemorrhage. No mass effect or midline shift. No hydrocephalus. The basilar cisterns are patent. No evidence of territorial infarct. No intracranial fluid collection.  CT MAXILLOFACIAL FINDINGS  There extensive facial bone fractures with involvement of both pterygoid plates consistent with bilateral LeFort injuries. Oblique mildly displaced fracture through the frontal sinus extends to involve the right frontal bone and associated foci of pneumocephalus. There is nondisplaced fracture component through the anterior left frontal sinus. Comminuted fractures of both orbits involving the lateral and inferior walls. There is fracture of the superior wall on the right with associated retro bulbar air. There is air in the retrobulbar are left orbit as well. There is nondisplaced fracture of the left zygomatic arch. Extensive opacification of the paranasal sinuses. The mandible appears intact. The globes appear intact. There is no definite entrapment of the extra-ocular muscles.  IMPRESSION: 1. Extensive facial bone injuries with bilateral LeFort type fractures. This appears to represent a LeFort 3 on the left and LeFort 1/2 on the right. 2. Right frontal sinus fracture extends to involve the right frontal bone with associated  pneumocephalus in the right frontal lobe. There is no associated parenchymal hemorrhage. 3. Multiple foci of retrobulbar air. The globes appear intact. There is no evidence of extraocular muscle entrapment. These results were called by telephone at the time of interpretation on 12/24/2014 at 6:20 am to Dr. Zadie Rhine , who verbally acknowledged these results.   Electronically Signed   By: Rubye Oaks M.D.   On:  12/24/2014 06:20   Ct Maxillofacial Wo Cm  12/24/2014   CLINICAL DATA:  Head injury. Hit in face by tree limb. Bruising to both eyes.  EXAM: CT HEAD WITHOUT CONTRAST  CT MAXILLOFACIAL WITHOUT CONTRAST  TECHNIQUE: Multidetector CT imaging of the head and maxillofacial structures were performed using the standard protocol without intravenous contrast. Multiplanar CT image reconstructions of the maxillofacial structures were also generated.  COMPARISON:  None.  FINDINGS: CT HEAD FINDINGS  There are small foci of pneumocephalus anteriorly related to right frontal bone fracture. This is better characterized on concurrently performed CT face. There is no associated intracranial hemorrhage. No mass effect or midline shift. No hydrocephalus. The basilar cisterns are patent. No evidence of territorial infarct. No intracranial fluid collection.  CT MAXILLOFACIAL FINDINGS  There extensive facial bone fractures with involvement of both pterygoid plates consistent with bilateral LeFort injuries. Oblique mildly displaced fracture through the frontal sinus extends to involve the right frontal bone and associated foci of pneumocephalus. There is nondisplaced fracture component through the anterior left frontal sinus. Comminuted fractures of both orbits involving the lateral and inferior walls. There is fracture of the superior wall on the right with associated retro bulbar air. There is air in the retrobulbar are left orbit as well. There is nondisplaced fracture of the left zygomatic arch. Extensive opacification of the paranasal sinuses. The mandible appears intact. The globes appear intact. There is no definite entrapment of the extra-ocular muscles.  IMPRESSION: 1. Extensive facial bone injuries with bilateral LeFort type fractures. This appears to represent a LeFort 3 on the left and LeFort 1/2 on the right. 2. Right frontal sinus fracture extends to involve the right frontal bone with associated pneumocephalus in the right  frontal lobe. There is no associated parenchymal hemorrhage. 3. Multiple foci of retrobulbar air. The globes appear intact. There is no evidence of extraocular muscle entrapment. These results were called by telephone at the time of interpretation on 12/24/2014 at 6:20 am to Dr. Zadie Rhine , who verbally acknowledged these results.   Electronically Signed   By: Rubye Oaks M.D.   On: 12/24/2014 06:20     Medications  ondansetron (ZOFRAN) injection 4 mg (4 mg Intravenous Given 12/24/14 0502)  ondansetron (ZOFRAN) injection 4 mg (4 mg Intravenous Given 12/24/14 0519)  fentaNYL (SUBLIMAZE) injection 100 mcg (100 mcg Intravenous Given 12/24/14 0553)  HYDROmorphone (DILAUDID) injection 1 mg (1 mg Intravenous Given 12/24/14 0615)    MDM   Final diagnoses:  Frontal sinus fracture, closed, initial encounter  Pneumocephalus, traumatic  Closed LeFort III fracture, initial encounter    Nursing notes including past medical history and social history reviewed and considered in documentation Ct imaging reviewed by myself and considered during evaluation     Joya Gaskins, MD 12/24/14 [phone]

## 2014-12-24 NOTE — ED Notes (Signed)
Attempted Report x1.   

## 2014-12-24 NOTE — Progress Notes (Signed)
UR completed.  Ivette Castronova, RN BSN [facility] CCM Trauma/Neuro ICU Case Manager [phone]  

## 2014-12-24 NOTE — Progress Notes (Signed)
Pt arrived to unit from ED, A/Ox4, VSS, pt rates pain a 4 at this time but denies need for pain medication. Chg bath completed. MRSA swab not collected at this time due to nasal trauma and bleeding.

## 2014-12-24 NOTE — Consult Note (Signed)
Reason for Consult:Facial trauma  Referring Physician: Trauma MD Alan Estrada is an 54 y.o. male.  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 Came to [facility] ED where found to be neuro intact. CT done which showed a lot of facial fractures. There was some frontal pneumocephaly noted, and neurosurgical consult requested.     Past Medical History  Diagnosis Date  . Arthritis   . Palpitations     Past Surgical History  Procedure Laterality Date  . Shoulder arthroscopy  03/2010    right  . Cystectomy  03/2010    from inside right shoulder  . Pilonidal cyst excision  1984  . Pilonidal cyst excision  09/03/2011    Dr Margot Chimes    History reviewed. No pertinent family history.  Social History:  reports that he has never smoked. He has never used smokeless tobacco. He reports that he drinks alcohol. He reports that he does not use illicit drugs.  Allergies: No Known Allergies  Medications: I have reviewed the patient's current medications.  Results for orders placed or performed during the hospital encounter of 12/24/14 (from the past 48 hour(s))  Basic metabolic panel     Status: Abnormal   Collection Time: 12/24/14  6:30 AM  Result Value Ref Range   Sodium 138 135 - 145 mmol/L   Potassium 3.5 3.5 - 5.1 mmol/L   Chloride 105 96 - 112 mmol/L   CO2 26 19 - 32 mmol/L   Glucose, Bld 132 (H) 70 - 99 mg/dL   BUN 10 6 - 23 mg/dL   Creatinine, Ser 0.82 0.50 - 1.35 mg/dL   Calcium 9.0 8.4 - 10.5 mg/dL   GFR calc non Af Amer >90 >90 mL/min   GFR calc Af Amer >90 >90 mL/min    Comment: (NOTE) The eGFR has been calculated using the CKD EPI equation. This calculation has not been validated in all clinical situations. eGFR's persistently <90 mL/min signify possible Chronic Kidney Disease.    Anion gap 7 5 - 15  CBC with Differential/Platelet     Status: Abnormal   Collection Time: 12/24/14  6:30 AM  Result Value Ref  Range   WBC 8.6 4.0 - 10.5 K/uL   RBC 4.66 4.22 - 5.81 MIL/uL   Hemoglobin 14.3 13.0 - 17.0 g/dL   HCT 40.1 39.0 - 52.0 %   MCV 86.1 78.0 - 100.0 fL   MCH 30.7 26.0 - 34.0 pg   MCHC 35.7 30.0 - 36.0 g/dL   RDW 12.9 11.5 - 15.5 %   Platelets 157 150 - 400 K/uL   Neutrophils Relative % 79 (H) 43 - 77 %   Neutro Abs 6.8 1.7 - 7.7 K/uL   Lymphocytes Relative 13 12 - 46 %   Lymphs Abs 1.1 0.7 - 4.0 K/uL   Monocytes Relative 6 3 - 12 %   Monocytes Absolute 0.5 0.1 - 1.0 K/uL   Eosinophils Relative 2 0 - 5 %   Eosinophils Absolute 0.1 0.0 - 0.7 K/uL   Basophils Relative 0 0 - 1 %   Basophils Absolute 0.0 0.0 - 0.1 K/uL  Type and screen     Status: None   Collection Time: 12/24/14  6:30 AM  Result Value Ref Range   ABO/RH(D) B POS    Antibody Screen NEG    Sample Expiration 12/27/2014   Ethanol     Status: None  Collection Time: 12/24/14  6:47 AM  Result Value Ref Range   Alcohol, Ethyl (B) <5 0 - 9 mg/dL    Comment:        LOWEST DETECTABLE LIMIT FOR SERUM ALCOHOL IS 11 mg/dL FOR MEDICAL PURPOSES ONLY     Ct Head Wo Contrast  12/24/2014   CLINICAL DATA:  Head injury. Hit in face by tree limb. Bruising to both eyes.  EXAM: CT HEAD WITHOUT CONTRAST  CT MAXILLOFACIAL WITHOUT CONTRAST  TECHNIQUE: Multidetector CT imaging of the head and maxillofacial structures were performed using the standard protocol without intravenous contrast. Multiplanar CT image reconstructions of the maxillofacial structures were also generated.  COMPARISON:  None.  FINDINGS: CT HEAD FINDINGS  There are small foci of pneumocephalus anteriorly related to right frontal bone fracture. This is better characterized on concurrently performed CT face. There is no associated intracranial hemorrhage. No mass effect or midline shift. No hydrocephalus. The basilar cisterns are patent. No evidence of territorial infarct. No intracranial fluid collection.  CT MAXILLOFACIAL FINDINGS  There extensive facial bone fractures with  involvement of both pterygoid plates consistent with bilateral LeFort injuries. Oblique mildly displaced fracture through the frontal sinus extends to involve the right frontal bone and associated foci of pneumocephalus. There is nondisplaced fracture component through the anterior left frontal sinus. Comminuted fractures of both orbits involving the lateral and inferior walls. There is fracture of the superior wall on the right with associated retro bulbar air. There is air in the retrobulbar are left orbit as well. There is nondisplaced fracture of the left zygomatic arch. Extensive opacification of the paranasal sinuses. The mandible appears intact. The globes appear intact. There is no definite entrapment of the extra-ocular muscles.  IMPRESSION: 1. Extensive facial bone injuries with bilateral LeFort type fractures. This appears to represent a LeFort 3 on the left and LeFort 1/2 on the right. 2. Right frontal sinus fracture extends to involve the right frontal bone with associated pneumocephalus in the right frontal lobe. There is no associated parenchymal hemorrhage. 3. Multiple foci of retrobulbar air. The globes appear intact. There is no evidence of extraocular muscle entrapment. These results were called by telephone at the time of interpretation on 12/24/2014 at 6:20 am to Dr. Ripley Fraise , who verbally acknowledged these results.   Electronically Signed   By: Jeb Levering M.D.   On: 12/24/2014 06:20   Ct Cervical Spine Wo Contrast  12/24/2014   CLINICAL DATA:  Neck pain following trauma after being struck with large tree limb. Multiple facial fractures.  EXAM: CT CERVICAL SPINE WITHOUT CONTRAST  TECHNIQUE: Multidetector CT imaging of the cervical spine was performed without intravenous contrast. Multiplanar CT image reconstructions were also generated.  COMPARISON:  CT maxillofacial CT obtained earlier in the day  FINDINGS: No cervical region fracture is appreciated. No spondylolisthesis.  Prevertebral soft tissues and predental space regions are normal.  Multiple facial fractures are noted, described in the maxillofacial CT report.  There is moderately severe disc space narrowing at C6-7 and C7-T1. There is moderate narrowing at C5-6. There is facet hypertrophy at multiple levels bilaterally, most notably at C4-5 and C5-6 on the right. No disc extrusion or stenosis. There are areas of calcification in the nuchal ligament of the mid cervical region posteriorly.  IMPRESSION: Multilevel osteoarthritic change. No cervical spine fracture or spondylolisthesis. Multiple facial fractures, described in detail in the maxillofacial report.   Electronically Signed   By: Lowella Grip M.D.   On: 12/24/2014  08:38   Ct Maxillofacial Wo Cm  12/24/2014   CLINICAL DATA:  Head injury. Hit in face by tree limb. Bruising to both eyes.  EXAM: CT HEAD WITHOUT CONTRAST  CT MAXILLOFACIAL WITHOUT CONTRAST  TECHNIQUE: Multidetector CT imaging of the head and maxillofacial structures were performed using the standard protocol without intravenous contrast. Multiplanar CT image reconstructions of the maxillofacial structures were also generated.  COMPARISON:  None.  FINDINGS: CT HEAD FINDINGS  There are small foci of pneumocephalus anteriorly related to right frontal bone fracture. This is better characterized on concurrently performed CT face. There is no associated intracranial hemorrhage. No mass effect or midline shift. No hydrocephalus. The basilar cisterns are patent. No evidence of territorial infarct. No intracranial fluid collection.  CT MAXILLOFACIAL FINDINGS  There extensive facial bone fractures with involvement of both pterygoid plates consistent with bilateral LeFort injuries. Oblique mildly displaced fracture through the frontal sinus extends to involve the right frontal bone and associated foci of pneumocephalus. There is nondisplaced fracture component through the anterior left frontal sinus. Comminuted  fractures of both orbits involving the lateral and inferior walls. There is fracture of the superior wall on the right with associated retro bulbar air. There is air in the retrobulbar are left orbit as well. There is nondisplaced fracture of the left zygomatic arch. Extensive opacification of the paranasal sinuses. The mandible appears intact. The globes appear intact. There is no definite entrapment of the extra-ocular muscles.  IMPRESSION: 1. Extensive facial bone injuries with bilateral LeFort type fractures. This appears to represent a LeFort 3 on the left and LeFort 1/2 on the right. 2. Right frontal sinus fracture extends to involve the right frontal bone with associated pneumocephalus in the right frontal lobe. There is no associated parenchymal hemorrhage. 3. Multiple foci of retrobulbar air. The globes appear intact. There is no evidence of extraocular muscle entrapment. These results were called by telephone at the time of interpretation on 12/24/2014 at 6:20 am to Dr. Ripley Fraise , who verbally acknowledged these results.   Electronically Signed   By: Jeb Levering M.D.   On: 12/24/2014 06:20    Blood pressure 146/95, pulse 72, temperature 98.2 F (36.8 C), temperature source Oral, resp. rate 16, height $RemoveBe'6\' 3"'FiogCgbHv$  (1.905 m), weight 115.667 kg (255 lb), SpO2 99 %. the patient is awake, alert, conversant. Follows complex commands. No evidence of CSF leakage. Sppech fluent and appropriate  Assessment/Plan: Pneumocephaly after facial fractures. Plan is to watch patient for now and repeat CT head in a few days. These communications usually heal on their own and do not become a problem. On occasion, there can be persistent communication that could require surgical repair. Will follow for now.  Faythe Ghee, MD 12/24/2014, 10:42 AM

## 2014-12-24 NOTE — ED Notes (Signed)
Urine sample collected via urinal and available bedside.

## 2014-12-24 NOTE — H&P (Signed)
Alan Estrada is an 54 y.o. male.   Chief Complaint: struck by falling tree limb HPI: 54 y.o male employed by [employer] working on [employer] early this morning was struck in the face by a large tree limb on ground level and brought to ED by EMS.  Denies LOC and has no amnesia surrounding the event.  Denies any electric shock from down lines.  Called EMS himself.  Only complains of facial pain and nausea.  Denies SOB, numbness or tingling in extremities, confusion, dizziness.  CT of head neck results as described below.  C spine cleared and collar removed.  Past Medical History  Diagnosis Date  . Arthritis   . Palpitations     Past Surgical History  Procedure Laterality Date  . Shoulder arthroscopy  03/2010    right  . Cystectomy  03/2010    from inside right shoulder  . Pilonidal cyst excision  1984  . Pilonidal cyst excision  09/03/2011    Dr Margot Chimes    History reviewed. No pertinent family history. Social History:  reports that he has never smoked. He has never used smokeless tobacco. He reports that he drinks alcohol. He reports that he does not use illicit drugs.  Allergies: No Known Allergies   (Not in a hospital admission)  Results for orders placed or performed during the hospital encounter of 12/24/14 (from the past 48 hour(s))  Basic metabolic panel     Status: Abnormal   Collection Time: 12/24/14  6:30 AM  Result Value Ref Range   Sodium 138 135 - 145 mmol/L   Potassium 3.5 3.5 - 5.1 mmol/L   Chloride 105 96 - 112 mmol/L   CO2 26 19 - 32 mmol/L   Glucose, Bld 132 (H) 70 - 99 mg/dL   BUN 10 6 - 23 mg/dL   Creatinine, Ser 0.82 0.50 - 1.35 mg/dL   Calcium 9.0 8.4 - 10.5 mg/dL   GFR calc non Af Amer >90 >90 mL/min   GFR calc Af Amer >90 >90 mL/min    Comment: (NOTE) The eGFR has been calculated using the CKD EPI equation. This calculation has not been validated in all clinical situations. eGFR's persistently <90 mL/min signify possible Chronic Kidney Disease.     Anion gap 7 5 - 15  CBC with Differential/Platelet     Status: Abnormal   Collection Time: 12/24/14  6:30 AM  Result Value Ref Range   WBC 8.6 4.0 - 10.5 K/uL   RBC 4.66 4.22 - 5.81 MIL/uL   Hemoglobin 14.3 13.0 - 17.0 g/dL   HCT 40.1 39.0 - 52.0 %   MCV 86.1 78.0 - 100.0 fL   MCH 30.7 26.0 - 34.0 pg   MCHC 35.7 30.0 - 36.0 g/dL   RDW 12.9 11.5 - 15.5 %   Platelets 157 150 - 400 K/uL   Neutrophils Relative % 79 (H) 43 - 77 %   Neutro Abs 6.8 1.7 - 7.7 K/uL   Lymphocytes Relative 13 12 - 46 %   Lymphs Abs 1.1 0.7 - 4.0 K/uL   Monocytes Relative 6 3 - 12 %   Monocytes Absolute 0.5 0.1 - 1.0 K/uL   Eosinophils Relative 2 0 - 5 %   Eosinophils Absolute 0.1 0.0 - 0.7 K/uL   Basophils Relative 0 0 - 1 %   Basophils Absolute 0.0 0.0 - 0.1 K/uL   Ct Head Wo Contrast  12/24/2014   CLINICAL DATA:  Head injury. Hit in face  by tree limb. Bruising to both eyes.  EXAM: CT HEAD WITHOUT CONTRAST  CT MAXILLOFACIAL WITHOUT CONTRAST  TECHNIQUE: Multidetector CT imaging of the head and maxillofacial structures were performed using the standard protocol without intravenous contrast. Multiplanar CT image reconstructions of the maxillofacial structures were also generated.  COMPARISON:  None.  FINDINGS: CT HEAD FINDINGS  There are small foci of pneumocephalus anteriorly related to right frontal bone fracture. This is better characterized on concurrently performed CT face. There is no associated intracranial hemorrhage. No mass effect or midline shift. No hydrocephalus. The basilar cisterns are patent. No evidence of territorial infarct. No intracranial fluid collection.  CT MAXILLOFACIAL FINDINGS  There extensive facial bone fractures with involvement of both pterygoid plates consistent with bilateral LeFort injuries. Oblique mildly displaced fracture through the frontal sinus extends to involve the right frontal bone and associated foci of pneumocephalus. There is nondisplaced fracture component through the  anterior left frontal sinus. Comminuted fractures of both orbits involving the lateral and inferior walls. There is fracture of the superior wall on the right with associated retro bulbar air. There is air in the retrobulbar are left orbit as well. There is nondisplaced fracture of the left zygomatic arch. Extensive opacification of the paranasal sinuses. The mandible appears intact. The globes appear intact. There is no definite entrapment of the extra-ocular muscles.  IMPRESSION: 1. Extensive facial bone injuries with bilateral LeFort type fractures. This appears to represent a LeFort 3 on the left and LeFort 1/2 on the right. 2. Right frontal sinus fracture extends to involve the right frontal bone with associated pneumocephalus in the right frontal lobe. There is no associated parenchymal hemorrhage. 3. Multiple foci of retrobulbar air. The globes appear intact. There is no evidence of extraocular muscle entrapment. These results were called by telephone at the time of interpretation on 12/24/2014 at 6:20 am to Dr. Zadie Rhine , who verbally acknowledged these results.   Electronically Signed   By: Rubye Oaks M.D.   On: 12/24/2014 06:20   Ct Maxillofacial Wo Cm  12/24/2014   CLINICAL DATA:  Head injury. Hit in face by tree limb. Bruising to both eyes.  EXAM: CT HEAD WITHOUT CONTRAST  CT MAXILLOFACIAL WITHOUT CONTRAST  TECHNIQUE: Multidetector CT imaging of the head and maxillofacial structures were performed using the standard protocol without intravenous contrast. Multiplanar CT image reconstructions of the maxillofacial structures were also generated.  COMPARISON:  None.  FINDINGS: CT HEAD FINDINGS  There are small foci of pneumocephalus anteriorly related to right frontal bone fracture. This is better characterized on concurrently performed CT face. There is no associated intracranial hemorrhage. No mass effect or midline shift. No hydrocephalus. The basilar cisterns are patent. No evidence of  territorial infarct. No intracranial fluid collection.  CT MAXILLOFACIAL FINDINGS  There extensive facial bone fractures with involvement of both pterygoid plates consistent with bilateral LeFort injuries. Oblique mildly displaced fracture through the frontal sinus extends to involve the right frontal bone and associated foci of pneumocephalus. There is nondisplaced fracture component through the anterior left frontal sinus. Comminuted fractures of both orbits involving the lateral and inferior walls. There is fracture of the superior wall on the right with associated retro bulbar air. There is air in the retrobulbar are left orbit as well. There is nondisplaced fracture of the left zygomatic arch. Extensive opacification of the paranasal sinuses. The mandible appears intact. The globes appear intact. There is no definite entrapment of the extra-ocular muscles.  IMPRESSION: 1. Extensive facial bone  injuries with bilateral LeFort type fractures. This appears to represent a LeFort 3 on the left and LeFort 1/2 on the right. 2. Right frontal sinus fracture extends to involve the right frontal bone with associated pneumocephalus in the right frontal lobe. There is no associated parenchymal hemorrhage. 3. Multiple foci of retrobulbar air. The globes appear intact. There is no evidence of extraocular muscle entrapment. These results were called by telephone at the time of interpretation on 12/24/2014 at 6:20 am to Dr. Ripley Fraise , who verbally acknowledged these results.   Electronically Signed   By: Jeb Levering M.D.   On: 12/24/2014 06:20    Review of Systems  Constitutional: Negative for fever, chills and weight loss.  HENT: Negative for ear discharge, ear pain and hearing loss.   Eyes: Negative for blurred vision, double vision and photophobia.  Respiratory: Negative for cough, shortness of breath and wheezing.   Cardiovascular: Negative for chest pain and palpitations.  Gastrointestinal: Positive for  nausea. Negative for heartburn, vomiting and abdominal pain.  Genitourinary: Negative for dysuria, urgency and frequency.  Musculoskeletal: Negative for back pain and neck pain.  Skin: Negative.   Neurological: Positive for headaches. Negative for sensory change, focal weakness and loss of consciousness.  Endo/Heme/Allergies: Negative.   Psychiatric/Behavioral: Negative.     Blood pressure 137/87, pulse 51, temperature 98.2 F (36.8 C), temperature source Oral, resp. rate 20, height $RemoveBe'6\' 3"'qkOnsbRth$  (1.905 m), weight 255 lb (115.667 kg), SpO2 99 %. Physical Exam  Constitutional: He is oriented to person, place, and time. He appears well-developed and well-nourished. No distress.  HENT:  Head: Head is with contusion and with right periorbital erythema.  Right Ear: External ear normal. No drainage.  Left Ear: External ear normal. No drainage.  Nose: Nose lacerations present. Epistaxis is observed.  Eyes: Conjunctivae and EOM are normal. Pupils are equal, round, and reactive to light.  R Periorbital bruising and edema  Neck: Normal range of motion. Neck supple. No JVD present. No tracheal deviation present.  Cardiovascular: Normal rate, regular rhythm, normal heart sounds and intact distal pulses.   Respiratory: Effort normal and breath sounds normal. No respiratory distress. He has no wheezes. He has no rales.  GI: Soft. Bowel sounds are normal. He exhibits no distension. There is no tenderness. There is no rebound.  Musculoskeletal: Normal range of motion.  Neurological: He is alert and oriented to person, place, and time. No cranial nerve deficit. Coordination normal.  Skin: Skin is warm and dry.  Psychiatric: He has a normal mood and affect. His behavior is normal.     Assessment/Plan 53y.o male without significant pmh struck in face by large tree limb around 3am this morning.  Neurosurgery to evaluate.  Dr. Migdalia Dk [facility] has seen patient and will likely operate next week per her note.   Will admit to trauma step down for observation and pain management.  Alan Estrada 12/24/2014, 7:40 AM

## 2014-12-24 NOTE — Consult Note (Signed)
Reason for Consult:facial trauma Referring Physician: ED/Trauma  Alan Estrada is an 54 y.o. male.  HPI: The patient is a 54 yrs old wm in the ED with his wife for evaluation.  According to reports, he was working for [employer] trimming a tree branch when he was struck in the face.  He complains of facial pain but no difficulty with breathing or airway.  Complains of malocclusion at this time.  He has swelling and tenderness around the maxilla and nose.  CT report shows multiple fractures as discussed below.    Past Medical History  Diagnosis Date  . Arthritis   . Palpitations     Past Surgical History  Procedure Laterality Date  . Shoulder arthroscopy  03/2010    right  . Cystectomy  03/2010    from inside right shoulder  . Pilonidal cyst excision  1984  . Pilonidal cyst excision  09/03/2011    Dr Margot Chimes    History reviewed. No pertinent family history.  Social History:  reports that he has never smoked. He has never used smokeless tobacco. He reports that he drinks alcohol. He reports that he does not use illicit drugs.  Allergies: No Known Allergies  Medications: I have reviewed the patient's current medications.  Results for orders placed or performed during the hospital encounter of 12/24/14 (from the past 48 hour(s))  Basic metabolic panel     Status: Abnormal   Collection Time: 12/24/14  6:30 AM  Result Value Ref Range   Sodium 138 135 - 145 mmol/L   Potassium 3.5 3.5 - 5.1 mmol/L   Chloride 105 96 - 112 mmol/L   CO2 26 19 - 32 mmol/L   Glucose, Bld 132 (H) 70 - 99 mg/dL   BUN 10 6 - 23 mg/dL   Creatinine, Ser 0.82 0.50 - 1.35 mg/dL   Calcium 9.0 8.4 - 10.5 mg/dL   GFR calc non Af Amer >90 >90 mL/min   GFR calc Af Amer >90 >90 mL/min    Comment: (NOTE) The eGFR has been calculated using the CKD EPI equation. This calculation has not been validated in all clinical situations. eGFR's persistently <90 mL/min signify possible Chronic Kidney Disease.    Anion gap  7 5 - 15  CBC with Differential/Platelet     Status: Abnormal   Collection Time: 12/24/14  6:30 AM  Result Value Ref Range   WBC 8.6 4.0 - 10.5 K/uL   RBC 4.66 4.22 - 5.81 MIL/uL   Hemoglobin 14.3 13.0 - 17.0 g/dL   HCT 40.1 39.0 - 52.0 %   MCV 86.1 78.0 - 100.0 fL   MCH 30.7 26.0 - 34.0 pg   MCHC 35.7 30.0 - 36.0 g/dL   RDW 12.9 11.5 - 15.5 %   Platelets 157 150 - 400 K/uL   Neutrophils Relative % 79 (H) 43 - 77 %   Neutro Abs 6.8 1.7 - 7.7 K/uL   Lymphocytes Relative 13 12 - 46 %   Lymphs Abs 1.1 0.7 - 4.0 K/uL   Monocytes Relative 6 3 - 12 %   Monocytes Absolute 0.5 0.1 - 1.0 K/uL   Eosinophils Relative 2 0 - 5 %   Eosinophils Absolute 0.1 0.0 - 0.7 K/uL   Basophils Relative 0 0 - 1 %   Basophils Absolute 0.0 0.0 - 0.1 K/uL    Ct Head Wo Contrast  12/24/2014   CLINICAL DATA:  Head injury. Hit in face by tree limb. Bruising to both  eyes.  EXAM: CT HEAD WITHOUT CONTRAST  CT MAXILLOFACIAL WITHOUT CONTRAST  TECHNIQUE: Multidetector CT imaging of the head and maxillofacial structures were performed using the standard protocol without intravenous contrast. Multiplanar CT image reconstructions of the maxillofacial structures were also generated.  COMPARISON:  None.  FINDINGS: CT HEAD FINDINGS  There are small foci of pneumocephalus anteriorly related to right frontal bone fracture. This is better characterized on concurrently performed CT face. There is no associated intracranial hemorrhage. No mass effect or midline shift. No hydrocephalus. The basilar cisterns are patent. No evidence of territorial infarct. No intracranial fluid collection.  CT MAXILLOFACIAL FINDINGS  There extensive facial bone fractures with involvement of both pterygoid plates consistent with bilateral LeFort injuries. Oblique mildly displaced fracture through the frontal sinus extends to involve the right frontal bone and associated foci of pneumocephalus. There is nondisplaced fracture component through the anterior left  frontal sinus. Comminuted fractures of both orbits involving the lateral and inferior walls. There is fracture of the superior wall on the right with associated retro bulbar air. There is air in the retrobulbar are left orbit as well. There is nondisplaced fracture of the left zygomatic arch. Extensive opacification of the paranasal sinuses. The mandible appears intact. The globes appear intact. There is no definite entrapment of the extra-ocular muscles.  IMPRESSION: 1. Extensive facial bone injuries with bilateral LeFort type fractures. This appears to represent a LeFort 3 on the left and LeFort 1/2 on the right. 2. Right frontal sinus fracture extends to involve the right frontal bone with associated pneumocephalus in the right frontal lobe. There is no associated parenchymal hemorrhage. 3. Multiple foci of retrobulbar air. The globes appear intact. There is no evidence of extraocular muscle entrapment. These results were called by telephone at the time of interpretation on 12/24/2014 at 6:20 am to Dr. Ripley Fraise , who verbally acknowledged these results.   Electronically Signed   By: Jeb Levering M.D.   On: 12/24/2014 06:20   Ct Maxillofacial Wo Cm  12/24/2014   CLINICAL DATA:  Head injury. Hit in face by tree limb. Bruising to both eyes.  EXAM: CT HEAD WITHOUT CONTRAST  CT MAXILLOFACIAL WITHOUT CONTRAST  TECHNIQUE: Multidetector CT imaging of the head and maxillofacial structures were performed using the standard protocol without intravenous contrast. Multiplanar CT image reconstructions of the maxillofacial structures were also generated.  COMPARISON:  None.  FINDINGS: CT HEAD FINDINGS  There are small foci of pneumocephalus anteriorly related to right frontal bone fracture. This is better characterized on concurrently performed CT face. There is no associated intracranial hemorrhage. No mass effect or midline shift. No hydrocephalus. The basilar cisterns are patent. No evidence of territorial  infarct. No intracranial fluid collection.  CT MAXILLOFACIAL FINDINGS  There extensive facial bone fractures with involvement of both pterygoid plates consistent with bilateral LeFort injuries. Oblique mildly displaced fracture through the frontal sinus extends to involve the right frontal bone and associated foci of pneumocephalus. There is nondisplaced fracture component through the anterior left frontal sinus. Comminuted fractures of both orbits involving the lateral and inferior walls. There is fracture of the superior wall on the right with associated retro bulbar air. There is air in the retrobulbar are left orbit as well. There is nondisplaced fracture of the left zygomatic arch. Extensive opacification of the paranasal sinuses. The mandible appears intact. The globes appear intact. There is no definite entrapment of the extra-ocular muscles.  IMPRESSION: 1. Extensive facial bone injuries with bilateral LeFort type fractures.  This appears to represent a LeFort 3 on the left and LeFort 1/2 on the right. 2. Right frontal sinus fracture extends to involve the right frontal bone with associated pneumocephalus in the right frontal lobe. There is no associated parenchymal hemorrhage. 3. Multiple foci of retrobulbar air. The globes appear intact. There is no evidence of extraocular muscle entrapment. These results were called by telephone at the time of interpretation on 12/24/2014 at 6:20 am to Dr. Ripley Fraise , who verbally acknowledged these results.   Electronically Signed   By: Jeb Levering M.D.   On: 12/24/2014 06:20    Review of Systems  Constitutional: Negative.   HENT: Negative.   Eyes: Negative.   Respiratory: Negative.   Cardiovascular: Negative.   Gastrointestinal: Negative.   Genitourinary: Negative.   Musculoskeletal: Negative.   Skin: Negative.   Neurological: Negative.   Psychiatric/Behavioral: Negative.    Blood pressure 137/87, pulse 51, temperature 98.2 F (36.8 C),  temperature source Oral, resp. rate 20, height _0  (1.905 m), weight 115.667 kg (255 lb), SpO2 99 %. Physical Exam  Constitutional: He is oriented to person, place, and time. He appears well-developed and well-nourished.  HENT:  Right Ear: External ear normal.  Left Ear: External ear normal.  Eyes: Conjunctivae and EOM are normal. Pupils are equal, round, and reactive to light.  Cardiovascular: Normal rate.   Respiratory: Effort normal.  GI: Soft.  Neurological: He is alert and oriented to person, place, and time.  Skin: Skin is warm.  Psychiatric: He has a normal mood and affect. His behavior is normal. Judgment and thought content normal.    Assessment/Plan: Surgery will likely be needed in the next week.  Plan for arranging an OR time.  Keep head of bed elevated as able.  Liquid only diet, no chewing.  Keep mouth clean.  No nose blowing.  [place] 12/24/2014, 7:39 AM

## 2014-12-24 NOTE — ED Notes (Signed)
Spoke with pharmacy about sending lovenox and .045% nacl with kcl

## 2014-12-24 NOTE — ED Notes (Signed)
ENT MD at bedside.

## 2014-12-24 NOTE — ED Notes (Signed)
MD at bedside. 

## 2014-12-24 NOTE — ED Notes (Signed)
Trauma MD at bedside.

## 2014-12-25 LAB — CBC
HCT: 38.6 % — ABNORMAL LOW (ref 39.0–52.0)
Hemoglobin: 13.6 g/dL (ref 13.0–17.0)
MCH: 31 pg (ref 26.0–34.0)
MCHC: 35.2 g/dL (ref 30.0–36.0)
MCV: 87.9 fL (ref 78.0–100.0)
Platelets: 171 10*3/uL (ref 150–400)
RBC: 4.39 MIL/uL (ref 4.22–5.81)
RDW: 13 % (ref 11.5–15.5)
WBC: 7 10*3/uL (ref 4.0–10.5)

## 2014-12-25 NOTE — Progress Notes (Signed)
Subjective: Comfortable for the most part   Objective: Vital signs in last 24 hours: Temp:  [97.5 F (36.4 C)-98.1 F (36.7 C)] 98.1 F (36.7 C) (02/06 0821) Pulse Rate:  [48-76] 59 (02/06 0821) Resp:  [9-18] 11 (02/06 0821) BP: (102-134)/(58-93) 114/81 mmHg (02/06 0821) SpO2:  [93 %-100 %] 99 % (02/06 0821) Weight:  [247 lb 2.2 oz (112.1 kg)] 247 lb 2.2 oz (112.1 kg) (02/05 1639) Last BM Date: 12/23/14  Intake/Output from previous day: 02/05 0701 - 02/06 0700 In: 200 [I.V.:200] Out: -  Intake/Output this shift:    Awake and alert Face wounds stable Lungs clear  Lab Results:   Recent Labs  12/24/14 0630 12/25/14 0239  WBC 8.6 7.0  HGB 14.3 13.6  HCT 40.1 38.6*  PLT 157 171   BMET  Recent Labs  12/24/14 0630  NA 138  K 3.5  CL 105  CO2 26  GLUCOSE 132*  BUN 10  CREATININE 0.82  CALCIUM 9.0   PT/INR No results for input(s): LABPROT, INR in the last 72 hours. ABG No results for input(s): PHART, HCO3 in the last 72 hours.  Invalid input(s): PCO2, PO2  Studies/Results: Ct Head Wo Contrast  12/24/2014   CLINICAL DATA:  Head injury. Hit in face by tree limb. Bruising to both eyes.  EXAM: CT HEAD WITHOUT CONTRAST  CT MAXILLOFACIAL WITHOUT CONTRAST  TECHNIQUE: Multidetector CT imaging of the head and maxillofacial structures were performed using the standard protocol without intravenous contrast. Multiplanar CT image reconstructions of the maxillofacial structures were also generated.  COMPARISON:  None.  FINDINGS: CT HEAD FINDINGS  There are small foci of pneumocephalus anteriorly related to right frontal bone fracture. This is better characterized on concurrently performed CT face. There is no associated intracranial hemorrhage. No mass effect or midline shift. No hydrocephalus. The basilar cisterns are patent. No evidence of territorial infarct. No intracranial fluid collection.  CT MAXILLOFACIAL FINDINGS  There extensive facial bone fractures with  involvement of both pterygoid plates consistent with bilateral LeFort injuries. Oblique mildly displaced fracture through the frontal sinus extends to involve the right frontal bone and associated foci of pneumocephalus. There is nondisplaced fracture component through the anterior left frontal sinus. Comminuted fractures of both orbits involving the lateral and inferior walls. There is fracture of the superior wall on the right with associated retro bulbar air. There is air in the retrobulbar are left orbit as well. There is nondisplaced fracture of the left zygomatic arch. Extensive opacification of the paranasal sinuses. The mandible appears intact. The globes appear intact. There is no definite entrapment of the extra-ocular muscles.  IMPRESSION: 1. Extensive facial bone injuries with bilateral LeFort type fractures. This appears to represent a LeFort 3 on the left and LeFort 1/2 on the right. 2. Right frontal sinus fracture extends to involve the right frontal bone with associated pneumocephalus in the right frontal lobe. There is no associated parenchymal hemorrhage. 3. Multiple foci of retrobulbar air. The globes appear intact. There is no evidence of extraocular muscle entrapment. These results were called by telephone at the time of interpretation on 12/24/2014 at 6:20 am to Dr. Zadie Rhine , who verbally acknowledged these results.   Electronically Signed   By: Rubye Oaks M.D.   On: 12/24/2014 06:20   Ct Cervical Spine Wo Contrast  12/24/2014   CLINICAL DATA:  Neck pain following trauma after being struck with large tree limb. Multiple facial fractures.  EXAM: CT CERVICAL SPINE WITHOUT CONTRAST  TECHNIQUE:  Multidetector CT imaging of the cervical spine was performed without intravenous contrast. Multiplanar CT image reconstructions were also generated.  COMPARISON:  CT maxillofacial CT obtained earlier in the day  FINDINGS: No cervical region fracture is appreciated. No spondylolisthesis.  Prevertebral soft tissues and predental space regions are normal.  Multiple facial fractures are noted, described in the maxillofacial CT report.  There is moderately severe disc space narrowing at C6-7 and C7-T1. There is moderate narrowing at C5-6. There is facet hypertrophy at multiple levels bilaterally, most notably at C4-5 and C5-6 on the right. No disc extrusion or stenosis. There are areas of calcification in the nuchal ligament of the mid cervical region posteriorly.  IMPRESSION: Multilevel osteoarthritic change. No cervical spine fracture or spondylolisthesis. Multiple facial fractures, described in detail in the maxillofacial report.   Electronically Signed   By: Bretta BangWilliam  Woodruff M.D.   On: 12/24/2014 08:38   Ct Maxillofacial Wo Cm  12/24/2014   CLINICAL DATA:  Head injury. Hit in face by tree limb. Bruising to both eyes.  EXAM: CT HEAD WITHOUT CONTRAST  CT MAXILLOFACIAL WITHOUT CONTRAST  TECHNIQUE: Multidetector CT imaging of the head and maxillofacial structures were performed using the standard protocol without intravenous contrast. Multiplanar CT image reconstructions of the maxillofacial structures were also generated.  COMPARISON:  None.  FINDINGS: CT HEAD FINDINGS  There are small foci of pneumocephalus anteriorly related to right frontal bone fracture. This is better characterized on concurrently performed CT face. There is no associated intracranial hemorrhage. No mass effect or midline shift. No hydrocephalus. The basilar cisterns are patent. No evidence of territorial infarct. No intracranial fluid collection.  CT MAXILLOFACIAL FINDINGS  There extensive facial bone fractures with involvement of both pterygoid plates consistent with bilateral LeFort injuries. Oblique mildly displaced fracture through the frontal sinus extends to involve the right frontal bone and associated foci of pneumocephalus. There is nondisplaced fracture component through the anterior left frontal sinus. Comminuted  fractures of both orbits involving the lateral and inferior walls. There is fracture of the superior wall on the right with associated retro bulbar air. There is air in the retrobulbar are left orbit as well. There is nondisplaced fracture of the left zygomatic arch. Extensive opacification of the paranasal sinuses. The mandible appears intact. The globes appear intact. There is no definite entrapment of the extra-ocular muscles.  IMPRESSION: 1. Extensive facial bone injuries with bilateral LeFort type fractures. This appears to represent a LeFort 3 on the left and LeFort 1/2 on the right. 2. Right frontal sinus fracture extends to involve the right frontal bone with associated pneumocephalus in the right frontal lobe. There is no associated parenchymal hemorrhage. 3. Multiple foci of retrobulbar air. The globes appear intact. There is no evidence of extraocular muscle entrapment. These results were called by telephone at the time of interpretation on 12/24/2014 at 6:20 am to Dr. Zadie RhineNALD WICKLINE , who verbally acknowledged these results.   Electronically Signed   By: Rubye OaksMelanie  Ehinger M.D.   On: 12/24/2014 06:20    Anti-infectives: Anti-infectives    None      Assessment/Plan:  S/p blunt trauma to the face  Continue current care Keep on liquids OR for repair facial trauma next week.  LOS: 1 day    Clea Dubach A 12/25/2014

## 2014-12-25 NOTE — Progress Notes (Signed)
Patient ID: Alan PiggJeffrey W Estrada, male   DOB: [birth year], 54 y.o.   MRN: [id number] Vital signs are stable Motor function appears intact Level of consciousness is normal Possible repair of facial fractures in the beginning of the coming week. No contraindication to surgery

## 2014-12-26 MED ORDER — DOCUSATE SODIUM 100 MG PO CAPS: 100.0000 mg | ORAL_CAPSULE | Freq: Two times a day (BID) | ORAL | Status: DC

## 2014-12-26 MED ORDER — HYDROCODONE-ACETAMINOPHEN 10-325 MG PO TABS: 0.5000 | ORAL_TABLET | Freq: Four times a day (QID) | ORAL | Status: DC | PRN

## 2014-12-26 NOTE — Progress Notes (Signed)
Pt D/C'd home per MD orders. D/C instructions given to pt and pt's wife.pt and wife verbalize understanding. VS stable. No s/s of acute distress noted.

## 2014-12-26 NOTE — Progress Notes (Signed)
  Subjective: Had some nausea. Otherwise pain ok  Objective: Vital signs in last 24 hours: Temp:  [97.7 F (36.5 C)-98.1 F (36.7 C)] 97.9 F (36.6 C) (02/07 0306) Pulse Rate:  [56-74] 72 (02/07 1135) Resp:  [10-31] 12 (02/07 1135) BP: (109-134)/(65-90) 131/90 mmHg (02/07 1135) SpO2:  [90 %-98 %] 90 % (02/07 1135) Last BM Date: 12/23/14  Intake/Output from previous day: 02/06 0701 - 02/07 0700 In: 1830 [P.O.:480; I.V.:1350] Out: 350 [Urine:350] Intake/Output this shift:    Alert, nad, sitting up eating a milkshake approp Facial bruising Soft, nd, nt Mae, good pulses  Lab Results:   Recent Labs  12/24/14 0630 12/25/14 0239  WBC 8.6 7.0  HGB 14.3 13.6  HCT 40.1 38.6*  PLT 157 171   BMET  Recent Labs  12/24/14 0630  NA 138  K 3.5  CL 105  CO2 26  GLUCOSE 132*  BUN 10  CREATININE 0.82  CALCIUM 9.0   PT/INR No results for input(s): LABPROT, INR in the last 72 hours. ABG No results for input(s): PHART, HCO3 in the last 72 hours.  Invalid input(s): PCO2, PO2  Studies/Results: No results found.  Anti-infectives: Anti-infectives    None      Assessment/Plan: Multiple facial fx's Will try to check with plastics and see when planning on surgery O/w continue current care  Mary SellaEric M. Andrey CampanileWilson, MD, FACS General, Bariatric, & Minimally Invasive Surgery [facility] [place]    LOS: 2 days    Atilano InaWILSON,Jaquitta Dupriest M 12/26/2014

## 2014-12-26 NOTE — Discharge Instructions (Signed)
-  Follow up appointment at 8am on 12/28/14 with Dr. Kelly SplinterSanger -Keep head of bed elevated as able.  -Liquid only diet, no chewing.  -Keep mouth clean.  -No nose blowing.   Facial Fracture A facial fracture is a break in one of the bones of your face. HOME CARE INSTRUCTIONS   Protect the injured part of your face until it is healed.  Do not participate in activities which give chance for re-injury until your doctor approves.  Gently wash and dry your face.  Wear head and facial protection while riding a bicycle, motorcycle, or snowmobile. SEEK MEDICAL CARE IF:   An oral temperature above 102 F (38.9 C) develops.  You have severe headaches or notice changes in your vision.  You have new numbness or tingling in your face.  You develop nausea (feeling sick to your stomach), vomiting or a stiff neck. SEEK IMMEDIATE MEDICAL CARE IF:   You develop difficulty seeing or experience double vision.  You become dizzy, lightheaded, or faint.  You develop trouble speaking, breathing, or swallowing.  You have a watery discharge from your nose or ear. MAKE SURE YOU:   Understand these instructions.  Will watch your condition.  Will get help right away if you are not doing well or get worse. Document Released: 11/05/2005 Document Revised: 01/28/2012 Document Reviewed: 06/24/2008 [facility] [employer] Patient Information 2015 [place]. This information is not intended to replace advice given to you by your health care provider. Make sure you discuss any questions you have with your health care provider.

## 2014-12-26 NOTE — Discharge Summary (Signed)
[facility] Trauma Service Discharge Summary   Patient ID: Alan Estrada MRN: [id number] DOB/AGE: [birth year] 54 y.o.  Admit date: 12/24/2014 Discharge date: 12/26/2014  Discharge Diagnoses Patient Active Problem List   Diagnosis Date Noted  . Extensive facial fractures 12/24/2014    Consultants Dr. Gerlene Fee & Dr. Danielle Dess (Neurosurgery) Dr. Kelly Splinter (Plastic surgery/facial surgeon)  Procedures None  Hospital Course:  54 y.o male employed by [employer] working on [employer] early this morning was struck in the face by a large tree limb on ground level and brought to ED by EMS. Denies LOC and has no amnesia surrounding the event. Denies any electric shock from down lines. Called EMS himself. Only complains of facial pain and nausea. Denies SOB, numbness or tingling in extremities, confusion, dizziness. CT of head neck results as described below. C spine cleared and collar removed.  Workup showed multiple facial fractures - Lefort III on one side and II on other.  Open sinus into cranial area.  Dr. Kelly Splinter was consulted and recommended liquid diet no chewing, surgery next week.  Recommended no nose blowing, keep head of bed elevated, keep mouth clean.  Neurosurgery was consulted and recommended conservative management of his pneumocephaly.  He recommended repeat CT in a few days.  Dr. Danielle Dess saw 2 subsequent days and did not recommend further imaging and signed off.  Follow as needed with neurosurgery. Patient was admitted and was transferred to the SDU.  Diet was advanced to clears.  On HD #2, the patient was voiding well, tolerating diet, ambulating well, pain well controlled, vital signs stable, and felt stable for discharge home.  Patient will follow up with Dr. Kelly Splinter of plastic surgery in her office on Tuesday 12/28/14 at 8:00am.        Medication List    STOP taking these medications        HYDROcodone-acetaminophen 5-325 MG per tablet  Commonly known as:   NORCO/VICODIN  Replaced by:  HYDROcodone-acetaminophen 10-325 MG per tablet      TAKE these medications        ALLERGY MED PO  Take 1 tablet by mouth daily.     ALLERGY PO  Take 1 tablet by mouth daily.     docusate sodium 100 MG capsule  Commonly known as:  COLACE  Take 1 capsule (100 mg total) by mouth 2 (two) times daily.     FISH OIL PO  Take 1 capsule by mouth daily.     GLUCOSAMINE CHONDR COMPLEX PO  Take 1 oz by mouth daily.     HYDROcodone-acetaminophen 10-325 MG per tablet  Commonly known as:  NORCO  Take 0.5-2 tablets by mouth every 6 (six) hours as needed (0.5 tablet for mild, 1 for moderate, 2 for severe pain).     multivitamin capsule  Take 1 capsule by mouth daily.     naproxen sodium 220 MG tablet  Commonly known as:  ANAPROX  Take 440 mg by mouth daily as needed (pain).             Follow-up Information    Follow up with [facility], DO On 12/28/2014.   Specialty:  Plastic Surgery   Why:  For post-hospital follow up about your facial surgery.  You will see Dr. Kelly Splinter the plastic surgeon to discuss surgery.  Please arrive at 8am for your appointment.     Contact information:   [address] [place] [address] [phone]       Signed: Aundra Millet  Rogelia BogaN. Dort, [facility]  Trauma Service [phone]  12/26/2014, 2:32 PM

## 2014-12-26 NOTE — Progress Notes (Signed)
Patient ID: Alan PiggJeffrey W Gange, male   DOB: 05-18-1961, 54 y.o.   MRN: [id number] Vital signs are stable Neurologically patient remained stable 4 repair of facial fractures tomorrow We will sign off at this time, please reconsult if necessary

## 2014-12-27 ENCOUNTER — Other Ambulatory Visit: Payer: Self-pay | Admitting: Plastic Surgery

## 2014-12-27 DIAGNOSIS — S02401B Maxillary fracture, unspecified, initial encounter for open fracture: Secondary | ICD-10-CM

## 2014-12-28 ENCOUNTER — Encounter (HOSPITAL_BASED_OUTPATIENT_CLINIC_OR_DEPARTMENT_OTHER): Payer: Self-pay | Admitting: *Deleted

## 2014-12-28 DIAGNOSIS — S02401A Maxillary fracture, unspecified, initial encounter for closed fracture: Secondary | ICD-10-CM | POA: Insufficient documentation

## 2014-12-29 ENCOUNTER — Other Ambulatory Visit: Payer: Self-pay | Admitting: Plastic Surgery

## 2014-12-30 ENCOUNTER — Encounter (HOSPITAL_BASED_OUTPATIENT_CLINIC_OR_DEPARTMENT_OTHER): Payer: Self-pay | Admitting: Plastic Surgery

## 2014-12-30 ENCOUNTER — Ambulatory Visit (HOSPITAL_BASED_OUTPATIENT_CLINIC_OR_DEPARTMENT_OTHER): Payer: Worker's Compensation | Admitting: Anesthesiology

## 2014-12-30 ENCOUNTER — Ambulatory Visit (HOSPITAL_BASED_OUTPATIENT_CLINIC_OR_DEPARTMENT_OTHER)
Admission: RE | Admit: 2014-12-30 | Discharge: 2014-12-31 | Disposition: A | Discharge status: Home / Self Care | Payer: Worker's Compensation | Source: Ambulatory Visit | Attending: Plastic Surgery | Admitting: Plastic Surgery

## 2014-12-30 ENCOUNTER — Encounter (HOSPITAL_BASED_OUTPATIENT_CLINIC_OR_DEPARTMENT_OTHER)
Admission: RE | Disposition: A | Discharge status: Home / Self Care | Payer: Self-pay | Source: Ambulatory Visit | Attending: Plastic Surgery

## 2014-12-30 DIAGNOSIS — S02401A Maxillary fracture, unspecified, initial encounter for closed fracture: Secondary | ICD-10-CM | POA: Diagnosis present

## 2014-12-30 DIAGNOSIS — W228XXA Striking against or struck by other objects, initial encounter: Secondary | ICD-10-CM | POA: Diagnosis not present

## 2014-12-30 DIAGNOSIS — S02401B Maxillary fracture, unspecified, initial encounter for open fracture: Secondary | ICD-10-CM

## 2014-12-30 DIAGNOSIS — S02609A Fracture of mandible, unspecified, initial encounter for closed fracture: Secondary | ICD-10-CM | POA: Diagnosis not present

## 2014-12-30 DIAGNOSIS — S02412D LeFort II fracture, subsequent encounter for fracture with routine healing: Secondary | ICD-10-CM

## 2014-12-30 HISTORY — DX: Other specified postprocedural states: Z98.890

## 2014-12-30 HISTORY — DX: Palpitations: R00.2

## 2014-12-30 HISTORY — PX: ORIF FACIAL FRACTURE: SHX2118

## 2014-12-30 HISTORY — DX: Gastro-esophageal reflux disease without esophagitis: K21.9

## 2014-12-30 HISTORY — DX: Nausea with vomiting, unspecified: R11.2

## 2014-12-30 LAB — POCT HEMOGLOBIN-HEMACUE: Hemoglobin: 16.4 g/dL (ref 13.0–17.0)

## 2014-12-30 SURGERY — OPEN REDUCTION INTERNAL FIXATION (ORIF) FACIAL FRACTURE
Anesthesia: General | Site: Face | Laterality: Bilateral

## 2014-12-30 MED ORDER — DIPHENHYDRAMINE HCL 50 MG/ML IJ SOLN: 12.5000 mg | Freq: Four times a day (QID) | INTRAMUSCULAR | Status: DC | PRN

## 2014-12-30 MED ORDER — SCOPOLAMINE 1 MG/3DAYS TD PT72
MEDICATED_PATCH | TRANSDERMAL | Status: AC
  Filled 2014-12-30: qty 1

## 2014-12-30 MED ORDER — HYDROMORPHONE HCL 1 MG/ML IJ SOLN
0.2500 mg | INTRAMUSCULAR | Status: DC | PRN
  Administered 2014-12-30 (×3): 0.5 mg via INTRAVENOUS

## 2014-12-30 MED ORDER — KETOROLAC TROMETHAMINE 60 MG/2ML IM SOLN: 60.0000 mg | Freq: Once | INTRAMUSCULAR | Status: DC

## 2014-12-30 MED ORDER — ONDANSETRON HCL 4 MG/2ML IJ SOLN
INTRAMUSCULAR | Status: DC | PRN
  Administered 2014-12-30: 4 mg via INTRAVENOUS

## 2014-12-30 MED ORDER — MIDAZOLAM HCL 2 MG/2ML IJ SOLN
INTRAMUSCULAR | Status: AC
  Filled 2014-12-30: qty 2

## 2014-12-30 MED ORDER — OXYMETAZOLINE HCL 0.05 % NA SOLN
NASAL | Status: AC
  Filled 2014-12-30: qty 15

## 2014-12-30 MED ORDER — FENTANYL CITRATE 0.05 MG/ML IJ SOLN: 50.0000 ug | INTRAMUSCULAR | Status: DC | PRN

## 2014-12-30 MED ORDER — ONDANSETRON HCL 4 MG/2ML IJ SOLN: 4.0000 mg | Freq: Four times a day (QID) | INTRAMUSCULAR | Status: DC | PRN

## 2014-12-30 MED ORDER — OXYCODONE HCL 5 MG/5ML PO SOLN: 5.0000 mg | Freq: Once | ORAL | Status: AC | PRN

## 2014-12-30 MED ORDER — BUPIVACAINE-EPINEPHRINE (PF) 0.25% -1:200000 IJ SOLN
INTRAMUSCULAR | Status: AC
  Filled 2014-12-30: qty 30

## 2014-12-30 MED ORDER — MIDAZOLAM HCL 2 MG/2ML IJ SOLN: 1.0000 mg | INTRAMUSCULAR | Status: DC | PRN

## 2014-12-30 MED ORDER — BACITRACIN ZINC 500 UNIT/GM EX OINT
TOPICAL_OINTMENT | CUTANEOUS | Status: AC
  Filled 2014-12-30: qty 0.9

## 2014-12-30 MED ORDER — IBUPROFEN 200 MG PO TABS
ORAL_TABLET | ORAL | Status: AC
  Filled 2014-12-30: qty 2

## 2014-12-30 MED ORDER — SUFENTANIL CITRATE 50 MCG/ML IV SOLN
INTRAVENOUS | Status: AC
  Filled 2014-12-30: qty 1

## 2014-12-30 MED ORDER — POLYETHYLENE GLYCOL 3350 17 G PO PACK: 17.0000 g | PACK | Freq: Every day | ORAL | Status: DC

## 2014-12-30 MED ORDER — PROPOFOL 10 MG/ML IV BOLUS
INTRAVENOUS | Status: DC | PRN
  Administered 2014-12-30: 300 mg via INTRAVENOUS

## 2014-12-30 MED ORDER — SUFENTANIL CITRATE 50 MCG/ML IV SOLN
INTRAVENOUS | Status: DC | PRN
  Administered 2014-12-30: 20 ug via INTRAVENOUS
  Administered 2014-12-30: 5 ug via INTRAVENOUS

## 2014-12-30 MED ORDER — MIDAZOLAM HCL 5 MG/5ML IJ SOLN
INTRAMUSCULAR | Status: DC | PRN
  Administered 2014-12-30 (×2): 2 mg via INTRAVENOUS

## 2014-12-30 MED ORDER — POLYETHYLENE GLYCOL 3350 17 G PO PACK: 17.0000 g | PACK | Freq: Every day | ORAL | Status: DC | PRN

## 2014-12-30 MED ORDER — LACTATED RINGERS IV SOLN
INTRAVENOUS | Status: DC
  Administered 2014-12-30 (×2): via INTRAVENOUS

## 2014-12-30 MED ORDER — KETOROLAC TROMETHAMINE 30 MG/ML IJ SOLN
60.0000 mg | Freq: Once | INTRAMUSCULAR | Status: AC
  Administered 2014-12-30: 60 mg via INTRAVENOUS

## 2014-12-30 MED ORDER — IBUPROFEN 100 MG/5ML PO SUSP
400.0000 mg | Freq: Four times a day (QID) | ORAL | Status: DC
  Administered 2014-12-30 – 2014-12-31 (×3): 400 mg via ORAL

## 2014-12-30 MED ORDER — ACETAMINOPHEN-CODEINE 120-12 MG/5ML PO SOLN
10.0000 mL | ORAL | Status: DC | PRN
  Administered 2014-12-30: 15 mL via ORAL
  Filled 2014-12-30: qty 20

## 2014-12-30 MED ORDER — HYDROMORPHONE HCL 1 MG/ML IJ SOLN
INTRAMUSCULAR | Status: AC
  Filled 2014-12-30: qty 1

## 2014-12-30 MED ORDER — OXYCODONE HCL 5 MG PO TABS: 5.0000 mg | ORAL_TABLET | Freq: Once | ORAL | Status: AC | PRN

## 2014-12-30 MED ORDER — CEFAZOLIN SODIUM-DEXTROSE 2-3 GM-% IV SOLR
INTRAVENOUS | Status: AC
  Filled 2014-12-30: qty 50

## 2014-12-30 MED ORDER — SCOPOLAMINE 1 MG/3DAYS TD PT72
1.0000 | MEDICATED_PATCH | TRANSDERMAL | Status: DC
  Administered 2014-12-30: 1.5 mg via TRANSDERMAL

## 2014-12-30 MED ORDER — DIPHENHYDRAMINE HCL 12.5 MG/5ML PO ELIX: 12.5000 mg | ORAL_SOLUTION | Freq: Four times a day (QID) | ORAL | Status: DC | PRN

## 2014-12-30 MED ORDER — KETOROLAC TROMETHAMINE 30 MG/ML IJ SOLN
INTRAMUSCULAR | Status: AC
  Filled 2014-12-30: qty 2

## 2014-12-30 MED ORDER — ACETAMINOPHEN-CODEINE 120-12 MG/5ML PO SOLN: 10.0000 mL | ORAL | Status: DC | PRN

## 2014-12-30 MED ORDER — IBUPROFEN 100 MG/5ML PO SUSP
ORAL | Status: AC
  Filled 2014-12-30: qty 20

## 2014-12-30 MED ORDER — ONDANSETRON HCL 4 MG/2ML IJ SOLN: 4.0000 mg | Freq: Once | INTRAMUSCULAR | Status: AC | PRN

## 2014-12-30 MED ORDER — LIDOCAINE-EPINEPHRINE 1 %-1:100000 IJ SOLN
INTRAMUSCULAR | Status: AC
  Filled 2014-12-30: qty 1

## 2014-12-30 MED ORDER — IBUPROFEN 100 MG/5ML PO SUSP
ORAL | Status: AC
  Filled 2014-12-30: qty 10

## 2014-12-30 MED ORDER — LIDOCAINE HCL (CARDIAC) 20 MG/ML IV SOLN
INTRAVENOUS | Status: DC | PRN
  Administered 2014-12-30: 50 mg via INTRAVENOUS

## 2014-12-30 MED ORDER — CEFAZOLIN SODIUM 1-5 GM-% IV SOLN
1.0000 g | Freq: Three times a day (TID) | INTRAVENOUS | Status: DC
  Administered 2014-12-30 – 2014-12-31 (×2): 1 g via INTRAVENOUS
  Filled 2014-12-30 (×2): qty 50

## 2014-12-30 MED ORDER — CEFAZOLIN SODIUM-DEXTROSE 2-3 GM-% IV SOLR
2.0000 g | INTRAVENOUS | Status: AC
  Administered 2014-12-30: 2 g via INTRAVENOUS

## 2014-12-30 MED ORDER — LIDOCAINE-EPINEPHRINE 1 %-1:100000 IJ SOLN
INTRAMUSCULAR | Status: DC | PRN
  Administered 2014-12-30: 4 mL

## 2014-12-30 MED ORDER — MORPHINE SULFATE 2 MG/ML IJ SOLN
2.0000 mg | INTRAMUSCULAR | Status: DC | PRN
  Administered 2014-12-30 – 2014-12-31 (×7): 2 mg via INTRAVENOUS
  Filled 2014-12-30 (×7): qty 1

## 2014-12-30 MED ORDER — KCL IN DEXTROSE-NACL 20-5-0.45 MEQ/L-%-% IV SOLN
INTRAVENOUS | Status: DC
  Administered 2014-12-30 – 2014-12-31 (×2): via INTRAVENOUS
  Filled 2014-12-30 (×2): qty 1000

## 2014-12-30 MED ORDER — OXYMETAZOLINE HCL 0.05 % NA SOLN
1.0000 | Freq: Two times a day (BID) | NASAL | Status: DC
Start: 2014-12-30 — End: 2014-12-30
  Administered 2014-12-30: 2 via NASAL

## 2014-12-30 MED ORDER — SUCCINYLCHOLINE CHLORIDE 20 MG/ML IJ SOLN
INTRAMUSCULAR | Status: DC | PRN
  Administered 2014-12-30: 120 mg via INTRAVENOUS

## 2014-12-30 MED ORDER — DEXAMETHASONE SODIUM PHOSPHATE 4 MG/ML IJ SOLN
INTRAMUSCULAR | Status: DC | PRN
  Administered 2014-12-30: 10 mg via INTRAVENOUS

## 2014-12-30 SURGICAL SUPPLY — 51 items
APL SRG 3 HI ABS STRL LF PLS (MISCELLANEOUS)
APPLICATOR DR MATTHEWS STRL (MISCELLANEOUS) IMPLANT
CANISTER SUCT 1200ML W/VALVE (MISCELLANEOUS) IMPLANT
CLOSURE WOUND 1/2 X4 (GAUZE/BANDAGES/DRESSINGS) ×1
DECANTER SPIKE VIAL GLASS SM (MISCELLANEOUS) ×4 IMPLANT
ELECT NDL BLADE 2-5/6 (NEEDLE) ×1 IMPLANT
ELECT NEEDLE BLADE 2-5/6 (NEEDLE) ×4 IMPLANT
ELECT REM PT RETURN 9FT ADLT (ELECTROSURGICAL) ×4
ELECTRODE REM PT RTRN 9FT ADLT (ELECTROSURGICAL) ×2 IMPLANT
GAUZE PACKING FOLDED 2  STR (GAUZE/BANDAGES/DRESSINGS)
GAUZE PACKING FOLDED 2 STR (GAUZE/BANDAGES/DRESSINGS) IMPLANT
GLOVE BIO SURGEON STRL SZ 6.5 (GLOVE) ×8 IMPLANT
GLOVE BIO SURGEONS STRL SZ 6.5 (GLOVE) ×3
GLOVE SURG SS PI 7.0 STRL IVOR (GLOVE) ×6 IMPLANT
GOWN STRL REUS W/ TWL LRG LVL3 (GOWN DISPOSABLE) ×4 IMPLANT
GOWN STRL REUS W/TWL LRG LVL3 (GOWN DISPOSABLE) ×12
NDL HYPO 30GX1 BEV (NEEDLE) ×1 IMPLANT
NEEDLE HYPO 30GX1 BEV (NEEDLE) ×4 IMPLANT
NS IRRIG 1000ML POUR BTL (IV SOLUTION) ×4 IMPLANT
PACK BASIN DAY SURGERY FS (CUSTOM PROCEDURE TRAY) ×4 IMPLANT
PACK ENT DAY SURGERY (CUSTOM PROCEDURE TRAY) ×4 IMPLANT
PATTIES SURGICAL .5 X3 (DISPOSABLE) ×3 IMPLANT
PENCIL BUTTON HOLSTER BLD 10FT (ELECTRODE) ×4 IMPLANT
PLATE 1.5/0.6 4X6 LEFT LONG L (Plate) ×3 IMPLANT
PLATE 1.5/0.6 4X6 RIGHT LONG L (Plate) ×3 IMPLANT
PLATE HYBRID MMF SM (Plate) ×6 IMPLANT
SCISSORS WIRE ANG 4 3/4 DISP (INSTRUMENTS) ×3 IMPLANT
SCREW HT X DR EMRG 1.8X5MM (Screw) ×3 IMPLANT
SCREW LOCK SELFDRIL 2.0X8M MMF (Screw) ×30 IMPLANT
SCREW NON LOCK HT X-DR 1.5X5 (Screw) ×6 IMPLANT
SCREW NON LOCK HT X-DR 1.5X6 (Screw) ×9 IMPLANT
SCREW NON LOCK HT X-DR 1.5X7 (Screw) ×3 IMPLANT
SHEILD EYE MED CORNL SHD 22X21 (OPHTHALMIC RELATED)
SHIELD EYE MED CORNL SHD 22X21 (OPHTHALMIC RELATED) ×1 IMPLANT
SLEEVE SCD COMPRESS KNEE MED (MISCELLANEOUS) ×4 IMPLANT
STRIP CLOSURE SKIN 1/2X4 (GAUZE/BANDAGES/DRESSINGS) ×2 IMPLANT
SUT MON AB 5-0 P3 18 (SUTURE) IMPLANT
SUT PROLENE 6 0 P 1 18 (SUTURE) IMPLANT
SUT SILK 6 0 P 1 (SUTURE) ×1 IMPLANT
SUT VIC AB 4-0 P-3 18XBRD (SUTURE) ×2 IMPLANT
SUT VIC AB 4-0 P3 18 (SUTURE) ×8
SUT VIC AB 4-0 SH 27 (SUTURE) ×4
SUT VIC AB 4-0 SH 27XANBCTRL (SUTURE) ×1 IMPLANT
SUT VIC AB 5-0 P-3 18X BRD (SUTURE) IMPLANT
SUT VIC AB 5-0 P3 18 (SUTURE)
SYR BULB 3OZ (MISCELLANEOUS) IMPLANT
TOWEL OR 17X24 6PK STRL BLUE (TOWEL DISPOSABLE) ×4 IMPLANT
TOWEL OR NON WOVEN STRL DISP B (DISPOSABLE) ×3 IMPLANT
TRAY DSU PREP LF (CUSTOM PROCEDURE TRAY) ×4 IMPLANT
TUBING SUCTION 1/4X6FT (MISCELLANEOUS) ×3 IMPLANT
YANKAUER SUCT BULB TIP NO VENT (SUCTIONS) ×3 IMPLANT

## 2014-12-30 NOTE — Brief Op Note (Signed)
12/30/2014  2:51 PM  PATIENT:  Alan Estrada  54 y.o. male  PRE-OPERATIVE DIAGNOSIS:  BILATERAL MAXILLARY SINUS  POST-OPERATIVE DIAGNOSIS:  BILATERAL MAXILLARY SINUS  PROCEDURE:  Procedure(s): OPEN REDUCTION INTERNAL FIXATION (ORIF) BILATERAL FACIAL FRACTURE (Bilateral)  SURGEON:  Surgeon(s) and Role:    * Claire Sanger, DO - Primary  PHYSICIAN ASSISTANT: Shawn Rayburn, PA  ASSISTANTS: none   ANESTHESIA:   general  EBL:  Total I/O In: 1000 [I.V.:1000] Out: -   BLOOD ADMINISTERED:none  DRAINS: none   LOCAL MEDICATIONS USED:  LIDOCAINE   SPECIMEN:  No Specimen  DISPOSITION OF SPECIMEN:  N/A  COUNTS:  YES  TOURNIQUET:  * No tourniquets in log *  DICTATION: .Dragon Dictation  PLAN OF CARE: Discharge to home after PACU  PATIENT DISPOSITION:  PACU - hemodynamically stable.   Delay start of Pharmacological VTE agent (>24hrs) due to surgical blood loss or risk of bleeding: no

## 2014-12-30 NOTE — H&P (View-Only) (Signed)
Reason for Consult:facial trauma Referring Physician: ED/Trauma  Alan Estrada is an 54 y.o. male.  HPI: The patient is a 54 yrs old wm in the ED with his wife for evaluation.  According to reports, he was working for [employer] trimming a tree branch when he was struck in the face.  He complains of facial pain but no difficulty with breathing or airway.  Complains of malocclusion at this time.  He has swelling and tenderness around the maxilla and nose.  CT report shows multiple fractures as discussed below.    Past Medical History  Diagnosis Date  . Arthritis   . Palpitations     Past Surgical History  Procedure Laterality Date  . Shoulder arthroscopy  03/2010    right  . Cystectomy  03/2010    from inside right shoulder  . Pilonidal cyst excision  1984  . Pilonidal cyst excision  09/03/2011    Dr Margot Chimes    History reviewed. No pertinent family history.  Social History:  reports that he has never smoked. He has never used smokeless tobacco. He reports that he drinks alcohol. He reports that he does not use illicit drugs.  Allergies: No Known Allergies  Medications: I have reviewed the patient's current medications.  Results for orders placed or performed during the hospital encounter of 12/24/14 (from the past 48 hour(s))  Basic metabolic panel     Status: Abnormal   Collection Time: 12/24/14  6:30 AM  Result Value Ref Range   Sodium 138 135 - 145 mmol/L   Potassium 3.5 3.5 - 5.1 mmol/L   Chloride 105 96 - 112 mmol/L   CO2 26 19 - 32 mmol/L   Glucose, Bld 132 (H) 70 - 99 mg/dL   BUN 10 6 - 23 mg/dL   Creatinine, Ser 0.82 0.50 - 1.35 mg/dL   Calcium 9.0 8.4 - 10.5 mg/dL   GFR calc non Af Amer >90 >90 mL/min   GFR calc Af Amer >90 >90 mL/min    Comment: (NOTE) The eGFR has been calculated using the CKD EPI equation. This calculation has not been validated in all clinical situations. eGFR's persistently <90 mL/min signify possible Chronic Kidney Disease.    Anion gap  7 5 - 15  CBC with Differential/Platelet     Status: Abnormal   Collection Time: 12/24/14  6:30 AM  Result Value Ref Range   WBC 8.6 4.0 - 10.5 K/uL   RBC 4.66 4.22 - 5.81 MIL/uL   Hemoglobin 14.3 13.0 - 17.0 g/dL   HCT 40.1 39.0 - 52.0 %   MCV 86.1 78.0 - 100.0 fL   MCH 30.7 26.0 - 34.0 pg   MCHC 35.7 30.0 - 36.0 g/dL   RDW 12.9 11.5 - 15.5 %   Platelets 157 150 - 400 K/uL   Neutrophils Relative % 79 (H) 43 - 77 %   Neutro Abs 6.8 1.7 - 7.7 K/uL   Lymphocytes Relative 13 12 - 46 %   Lymphs Abs 1.1 0.7 - 4.0 K/uL   Monocytes Relative 6 3 - 12 %   Monocytes Absolute 0.5 0.1 - 1.0 K/uL   Eosinophils Relative 2 0 - 5 %   Eosinophils Absolute 0.1 0.0 - 0.7 K/uL   Basophils Relative 0 0 - 1 %   Basophils Absolute 0.0 0.0 - 0.1 K/uL    Ct Head Wo Contrast  12/24/2014   CLINICAL DATA:  Head injury. Hit in face by tree limb. Bruising to both  eyes.  EXAM: CT HEAD WITHOUT CONTRAST  CT MAXILLOFACIAL WITHOUT CONTRAST  TECHNIQUE: Multidetector CT imaging of the head and maxillofacial structures were performed using the standard protocol without intravenous contrast. Multiplanar CT image reconstructions of the maxillofacial structures were also generated.  COMPARISON:  None.  FINDINGS: CT HEAD FINDINGS  There are small foci of pneumocephalus anteriorly related to right frontal bone fracture. This is better characterized on concurrently performed CT face. There is no associated intracranial hemorrhage. No mass effect or midline shift. No hydrocephalus. The basilar cisterns are patent. No evidence of territorial infarct. No intracranial fluid collection.  CT MAXILLOFACIAL FINDINGS  There extensive facial bone fractures with involvement of both pterygoid plates consistent with bilateral LeFort injuries. Oblique mildly displaced fracture through the frontal sinus extends to involve the right frontal bone and associated foci of pneumocephalus. There is nondisplaced fracture component through the anterior left  frontal sinus. Comminuted fractures of both orbits involving the lateral and inferior walls. There is fracture of the superior wall on the right with associated retro bulbar air. There is air in the retrobulbar are left orbit as well. There is nondisplaced fracture of the left zygomatic arch. Extensive opacification of the paranasal sinuses. The mandible appears intact. The globes appear intact. There is no definite entrapment of the extra-ocular muscles.  IMPRESSION: 1. Extensive facial bone injuries with bilateral LeFort type fractures. This appears to represent a LeFort 3 on the left and LeFort 1/2 on the right. 2. Right frontal sinus fracture extends to involve the right frontal bone with associated pneumocephalus in the right frontal lobe. There is no associated parenchymal hemorrhage. 3. Multiple foci of retrobulbar air. The globes appear intact. There is no evidence of extraocular muscle entrapment. These results were called by telephone at the time of interpretation on 12/24/2014 at 6:20 am to Dr. Ripley Fraise , who verbally acknowledged these results.   Electronically Signed   By: Jeb Levering M.D.   On: 12/24/2014 06:20   Ct Maxillofacial Wo Cm  12/24/2014   CLINICAL DATA:  Head injury. Hit in face by tree limb. Bruising to both eyes.  EXAM: CT HEAD WITHOUT CONTRAST  CT MAXILLOFACIAL WITHOUT CONTRAST  TECHNIQUE: Multidetector CT imaging of the head and maxillofacial structures were performed using the standard protocol without intravenous contrast. Multiplanar CT image reconstructions of the maxillofacial structures were also generated.  COMPARISON:  None.  FINDINGS: CT HEAD FINDINGS  There are small foci of pneumocephalus anteriorly related to right frontal bone fracture. This is better characterized on concurrently performed CT face. There is no associated intracranial hemorrhage. No mass effect or midline shift. No hydrocephalus. The basilar cisterns are patent. No evidence of territorial  infarct. No intracranial fluid collection.  CT MAXILLOFACIAL FINDINGS  There extensive facial bone fractures with involvement of both pterygoid plates consistent with bilateral LeFort injuries. Oblique mildly displaced fracture through the frontal sinus extends to involve the right frontal bone and associated foci of pneumocephalus. There is nondisplaced fracture component through the anterior left frontal sinus. Comminuted fractures of both orbits involving the lateral and inferior walls. There is fracture of the superior wall on the right with associated retro bulbar air. There is air in the retrobulbar are left orbit as well. There is nondisplaced fracture of the left zygomatic arch. Extensive opacification of the paranasal sinuses. The mandible appears intact. The globes appear intact. There is no definite entrapment of the extra-ocular muscles.  IMPRESSION: 1. Extensive facial bone injuries with bilateral LeFort type fractures.  This appears to represent a LeFort 3 on the left and LeFort 1/2 on the right. 2. Right frontal sinus fracture extends to involve the right frontal bone with associated pneumocephalus in the right frontal lobe. There is no associated parenchymal hemorrhage. 3. Multiple foci of retrobulbar air. The globes appear intact. There is no evidence of extraocular muscle entrapment. These results were called by telephone at the time of interpretation on 12/24/2014 at 6:20 am to Dr. Ripley Fraise , who verbally acknowledged these results.   Electronically Signed   By: Jeb Levering M.D.   On: 12/24/2014 06:20    Review of Systems  Constitutional: Negative.   HENT: Negative.   Eyes: Negative.   Respiratory: Negative.   Cardiovascular: Negative.   Gastrointestinal: Negative.   Genitourinary: Negative.   Musculoskeletal: Negative.   Skin: Negative.   Neurological: Negative.   Psychiatric/Behavioral: Negative.    Blood pressure 137/87, pulse 51, temperature 98.2 F (36.8 C),  temperature source Oral, resp. rate 20, height _0  (1.905 m), weight 115.667 kg (255 lb), SpO2 99 %. Physical Exam  Constitutional: He is oriented to person, place, and time. He appears well-developed and well-nourished.  HENT:  Right Ear: External ear normal.  Left Ear: External ear normal.  Eyes: Conjunctivae and EOM are normal. Pupils are equal, round, and reactive to light.  Cardiovascular: Normal rate.   Respiratory: Effort normal.  GI: Soft.  Neurological: He is alert and oriented to person, place, and time.  Skin: Skin is warm.  Psychiatric: He has a normal mood and affect. His behavior is normal. Judgment and thought content normal.    Assessment/Plan: Surgery will likely be needed in the next week.  Plan for arranging an OR time.  Keep head of bed elevated as able.  Liquid only diet, no chewing.  Keep mouth clean.  No nose blowing.  [place] 12/24/2014, 7:39 AM

## 2014-12-30 NOTE — Op Note (Signed)
Operative Note   DATE OF OPERATION: 12/30/2014  [facility]  SURGICAL DIVISION: Plastic Surgery  PREOPERATIVE DIAGNOSES:  Bilateral maxillary sinus / buttress fractures  POSTOPERATIVE DIAGNOSES:  same  PROCEDURE:  Open reduction internal fixation of right maxillary buttress fracture with Maxillo mandibular fixation  SURGEON: Wayland Denislaire Sanger, DO  ASSISTANT: Lazaro ArmsShawn Rayburn, PA  ANESTHESIA:  General.   COMPLICATIONS: None.   INDICATIONS FOR PROCEDURE:  The patient, Alan Estrada is a 54 y.o. male born on 17-Jul-1961, is here for treatment of multiple facial fractures. MRN: [id number]  CONSENT:  Informed consent was obtained directly from the patient. Risks, benefits and alternatives were fully discussed. Specific risks including but not limited to bleeding, infection, hematoma, seroma, scarring, pain, infection, contracture, asymmetry, wound healing problems, and need for further surgery were all discussed. The patient did have an ample opportunity to have questions answered to satisfaction.   DESCRIPTION OF PROCEDURE:  The patient was taken to the operating room. SCDs were placed and IV antibiotics were given. The patient's operative site was prepped and draped in a sterile fashion. A time out was performed and all information was confirmed to be correct.  General anesthesia was administered.  The local was injected into the maxillary buccal mucosa.  The bovie was used to incise the area and the bone elevator used to clear the site of the fracture.  The left was nondisplaced and the right was mildly displaced.  The fracture was reduced. There was good occlusion.  An L shaped plate was applied to the lateral buttress area.  Two screws were placed on each side of the fracture site.  The patient was explore on the left and since it was nondisplaced it was closed with 4-0 Vicryl.  The right side was closed after it was reduced and plated.  The MMF was applied and  had excellent occlusion.  The patient tolerated the procedure well.  There were no complications. The patient was allowed to wake from anesthesia, extubated and taken to the recovery room in satisfactory condition.  Wires were placed at the bedside.

## 2014-12-30 NOTE — Transfer of Care (Signed)
Immediate Anesthesia Transfer of Care Note  Patient: Alan PiggJeffrey W Estrada  Procedure(s) Performed: Procedure(s): OPEN REDUCTION INTERNAL FIXATION (ORIF) BILATERAL FACIAL FRACTURE (Bilateral)  Patient Location: PACU  Anesthesia Type:General  Level of Consciousness: awake, alert  and oriented  Airway & Oxygen Therapy: Patient Spontanous Breathing and Patient connected to face mask oxygen  Post-op Assessment: Report given to RN and Post -op Vital signs reviewed and stable  Post vital signs: Reviewed and stable  Last Vitals:  Filed Vitals:   12/30/14 1506  BP:   Pulse: 111  Temp: 36.8 C  Resp: 20    Complications: No apparent anesthesia complications

## 2014-12-30 NOTE — Anesthesia Postprocedure Evaluation (Signed)
  Anesthesia Post-op Note  Patient: Alan Estrada  Procedure(s) Performed: Procedure(s): OPEN REDUCTION INTERNAL FIXATION (ORIF) BILATERAL FACIAL FRACTURE (Bilateral)  Patient Location: PACU  Anesthesia Type: General   Level of Consciousness: awake, alert  and oriented  Airway and Oxygen Therapy: Patient Spontanous Breathing  Post-op Pain: mild  Post-op Assessment: Post-op Vital signs reviewed  Post-op Vital Signs: Reviewed  Last Vitals:  Filed Vitals:   12/30/14 1615  BP: 134/89  Pulse: 104  Temp:   Resp: 11    Complications: No apparent anesthesia complications

## 2014-12-30 NOTE — Interval H&P Note (Signed)
History and Physical Interval Note:  12/30/2014 7:12 AM  Alan Estrada  has presented today for surgery, with the diagnosis of BILATERAL MAXILLARY SINUS  The various methods of treatment have been discussed with the patient and family. After consideration of risks, benefits and other options for treatment, the patient has consented to  Procedure(s): OPEN REDUCTION INTERNAL FIXATION (ORIF) BILATERAL FACIAL FRACTURE (Bilateral) as a surgical intervention .  The patient's history has been reviewed, patient examined, no change in status, stable for surgery.  I have reviewed the patient's chart and labs.  Questions were answered to the patient's satisfaction.     SANGER,CLAIRE

## 2014-12-30 NOTE — Anesthesia Preprocedure Evaluation (Signed)
Anesthesia Evaluation  Patient identified by MRN, date of birth, ID band Patient awake    Reviewed: Allergy & Precautions, NPO status , Patient's Chart, lab work & pertinent test results  Airway Mallampati: I  TM Distance: >3 FB Neck ROM: Full    Dental  (+) Teeth Intact, Dental Advisory Given   Pulmonary  breath sounds clear to auscultation        Cardiovascular Rhythm:Regular Rate:Normal     Neuro/Psych    GI/Hepatic   Endo/Other    Renal/GU      Musculoskeletal   Abdominal   Peds  Hematology   Anesthesia Other Findings   Reproductive/Obstetrics                             Anesthesia Physical Anesthesia Plan  ASA: I  Anesthesia Plan: General   Post-op Pain Management:    Induction: Intravenous  Airway Management Planned: Nasal ETT  Additional Equipment:   Intra-op Plan:   Post-operative Plan: Extubation in OR  Informed Consent: I have reviewed the patients History and Physical, chart, labs and discussed the procedure including the risks, benefits and alternatives for the proposed anesthesia with the patient or authorized representative who has indicated his/her understanding and acceptance.   Dental advisory given  Plan Discussed with: CRNA, Anesthesiologist and Surgeon  Anesthesia Plan Comments:         Anesthesia Quick Evaluation  

## 2014-12-30 NOTE — Anesthesia Procedure Notes (Signed)
Procedure Name: Intubation Date/Time: 12/30/2014 12:56 PM Performed by: Zenia ResidesPAYNE, Srihari Shellhammer D Pre-anesthesia Checklist: Patient identified, Emergency Drugs available, Suction available and Patient being monitored Patient Re-evaluated:Patient Re-evaluated prior to inductionOxygen Delivery Method: Circle System Utilized Preoxygenation: Pre-oxygenation with 100% oxygen Intubation Type: IV induction Ventilation: Mask ventilation without difficulty Grade View: Grade I Nasal Tubes: Nasal prep performed and Nasal Rae Tube size: 7.5 mm Number of attempts: 1 Airway Equipment and Method: Video-laryngoscopy Placement Confirmation: ETT inserted through vocal cords under direct vision,  positive ETCO2 and breath sounds checked- equal and bilateral Secured at: 24 (R Nare) cm Tube secured with: Tape Dental Injury: Teeth and Oropharynx as per pre-operative assessment  Difficulty Due To: Difficulty was anticipated and Difficult Airway- due to limited oral opening

## 2014-12-30 NOTE — Addendum Note (Signed)
Addendum  created 12/30/14 1911 by Ronnette HilaLinda D Zamyra Allensworth, CRNA   Modules edited: Anesthesia Blocks and Procedures, Clinical Notes   Clinical Notes:  File: [id number]

## 2014-12-31 DIAGNOSIS — S02401A Maxillary fracture, unspecified, initial encounter for closed fracture: Secondary | ICD-10-CM | POA: Diagnosis not present

## 2014-12-31 NOTE — Discharge Instructions (Signed)
Keep head of bed elevated Wire cutters by bedside at all times Liquid dietFractured-Jaw Meal Plan The purpose of the fractured-jaw meal plan is to provide foods that can be easily blended and easily swallowed. This plan is typically used after jaw or mouth surgery, wired jaw surgery, or dental surgery. Foods in this plan need to be blended so that they can be sipped from a straw or given through a syringe. You should try to have at least three meals and three snacks daily. It is important to make sure you get enough calories and protein to prevent weight loss and help your body heal, especially after surgery. You may wish to include a liquid multivitamin in your plan to ensure that you get all the vitamins and minerals you need. Ask your health care provider for a recommendation.  HOW DO I PREPARE MY MEALS? All foods in this plan must be blended. Avoid nuts, seeds, skins, peels, bones, or any foods that cannot be blended to the right consistency. Make sure to eat a variety of foods from each food group every day. The following tips can help you as you blend your food: Remove skins, seeds, and peels from food. Cook meats and vegetables thoroughly. Cut foods into small pieces and mix with a small amount of liquid in a food processor or blender. Continue to add liquid until the food becomes thin enough to sip through a straw. Adding liquids such as juice, milk, cream, broth, gravy, or vegetable juice can help add flavor to foods. Heat foods after they have been blended to reduce the amount of foam created from blending. Heat or cool your foods to lukewarm temperatures if your teeth and mouth are sensitive to extreme temperatures. WHAT FOODS CAN I EAT? Make sure to eat a variety of foods from each food group.  Grains Hot cereals, such as oatmeal, grits, ground wheat cereals, and polenta. Rice and pasta. Couscous. Vegetables All cooked or canned vegetables, without seeds and skins. Vegetable  juices. Cooked potatoes, without skins. Fruit Any cooked or canned fruits, without seeds and skins. Fresh, peeled soft fruits, such as bananas and peaches, that can be blended until smooth. All fruit juices, without seeds and skins. Meat and Other Protein Sources Soft-boiled eggs, scrambled eggs, powdered eggs, pasteurized egg mixtures, and custard. Ground meats, such as hamburger, [place], sausage, and meatloaf. Tender, well-cooked meat, poultry, and fish prepared without bones or skin. Soft soy foods (such as tofu). Smooth nut butters. Dairy All are allowed. Beverages Coffee (regular or decaffeinated), tea, and mineral water. Condiments All seasonings and condiments that blend well. WHEN MAY I NEED TO SUPPLEMENT MY MEALS? If you begin to lose weight on this plan, you may need to increase the amount of food you are eating or the number of calories in your food or both. You can increase the number of calories by adding any of the following foods: Protein powder or powdered milk. Extra fats, such as margarine (without trans fat), sour cream, cream cheese, cream, and nut butters, such as peanut butter or almond butter. Sweets, such as honey, ice cream, blackstrap molasses, or sugar. Document Released: 04/25/2010 Document Revised: 03/22/2014 Document Reviewed: 10/02/2013 [facility] Patient Information 2015 [place], [place]. This information is not intended to replace advice given to you by your health care provider. Make sure you discuss any questions you have with your health care provider. Wired Jaw These instructions give you information on caring for yourself after your procedure. Your doctor may also give you  more specific instructions. Call your doctor if you have any problems or questions after your procedure. HOME CARE  Keep your mouth clean. Rinse your mouth with warm salt water after eating or drinking anything.  To make salt water, mix  teaspoon of salt in 1 cup warm  water.  Apply moist heat until the puffiness (swelling) goes down. Use a moist washcloth.  Brush the front of your teeth with a child size, soft toothbrush after you eat.  Use petroleum jelly on your lips to keep them from drying and cracking.  If you need to throw up (vomit), turn your face down or to the side and blow out.  To keep the puffiness down in your mouth, sit up or prop yourself up with 2 or 3 pillows.  Follow a broken (fractured) jaw diet.  Cover the wire with wax if any wires are cutting into your lips or gums.  Keep wire cutters with you at all times.  Certain medicines may make you sleepy. Do not drive while taking pain medicines.  Only take medicine as told by your doctor.  Keep all doctor visits as told. GET HELP RIGHT AWAY IF:   Your pain is severe and is not helped by medicine.  You feel sick to your stomach (nauseous) or throw up, and it is not helped by medicine.  You have a fever.  You feel that one or more wires have broken.  You have dizziness.  You pass out (faint). MAKE SURE YOU:  Understand these instructions.  Will watch your condition.  Will get help right away if you are not doing well or get worse. Document Released: 08/14/2008 Document Revised: 07/08/2013 Document Reviewed: 11/03/2008 [facility] Patient Information 2015 [place]. This information is not intended to replace advice given to you by your health care provider. Make sure you discuss any questions you have with your health care provider.

## 2015-01-04 ENCOUNTER — Encounter (HOSPITAL_BASED_OUTPATIENT_CLINIC_OR_DEPARTMENT_OTHER): Payer: Self-pay | Admitting: Plastic Surgery

## 2015-02-02 ENCOUNTER — Other Ambulatory Visit: Payer: Self-pay | Admitting: Plastic Surgery

## 2015-02-02 DIAGNOSIS — S0292XD Unspecified fracture of facial bones, subsequent encounter for fracture with routine healing: Secondary | ICD-10-CM

## 2015-02-04 ENCOUNTER — Encounter (HOSPITAL_BASED_OUTPATIENT_CLINIC_OR_DEPARTMENT_OTHER): Payer: Self-pay | Admitting: *Deleted

## 2015-02-04 NOTE — Progress Notes (Signed)
No labs needed

## 2015-02-10 ENCOUNTER — Encounter (HOSPITAL_BASED_OUTPATIENT_CLINIC_OR_DEPARTMENT_OTHER)
Admission: RE | Disposition: A | Discharge status: Home / Self Care | Payer: Self-pay | Source: Ambulatory Visit | Attending: Plastic Surgery

## 2015-02-10 ENCOUNTER — Encounter (HOSPITAL_BASED_OUTPATIENT_CLINIC_OR_DEPARTMENT_OTHER): Payer: Self-pay | Admitting: Plastic Surgery

## 2015-02-10 ENCOUNTER — Ambulatory Visit (HOSPITAL_BASED_OUTPATIENT_CLINIC_OR_DEPARTMENT_OTHER): Payer: Worker's Compensation | Admitting: Anesthesiology

## 2015-02-10 ENCOUNTER — Ambulatory Visit (HOSPITAL_BASED_OUTPATIENT_CLINIC_OR_DEPARTMENT_OTHER)
Admission: RE | Admit: 2015-02-10 | Discharge: 2015-02-10 | Disposition: A | Discharge status: Home / Self Care | Payer: Worker's Compensation | Source: Ambulatory Visit | Attending: Plastic Surgery | Admitting: Plastic Surgery

## 2015-02-10 DIAGNOSIS — M1990 Unspecified osteoarthritis, unspecified site: Secondary | ICD-10-CM | POA: Diagnosis not present

## 2015-02-10 DIAGNOSIS — Z9049 Acquired absence of other specified parts of digestive tract: Secondary | ICD-10-CM | POA: Diagnosis not present

## 2015-02-10 DIAGNOSIS — S0292XD Unspecified fracture of facial bones, subsequent encounter for fracture with routine healing: Secondary | ICD-10-CM

## 2015-02-10 DIAGNOSIS — K219 Gastro-esophageal reflux disease without esophagitis: Secondary | ICD-10-CM | POA: Insufficient documentation

## 2015-02-10 DIAGNOSIS — Z472 Encounter for removal of internal fixation device: Secondary | ICD-10-CM | POA: Insufficient documentation

## 2015-02-10 HISTORY — PX: MANDIBULAR HARDWARE REMOVAL: SHX5205

## 2015-02-10 LAB — POCT HEMOGLOBIN-HEMACUE: Hemoglobin: 15.2 g/dL (ref 13.0–17.0)

## 2015-02-10 SURGERY — REMOVAL, HARDWARE, MANDIBLE
Anesthesia: General | Site: Mouth

## 2015-02-10 MED ORDER — HYDROCODONE-ACETAMINOPHEN 5-325 MG PO TABS: 1.0000 | ORAL_TABLET | Freq: Four times a day (QID) | ORAL | Status: DC | PRN

## 2015-02-10 MED ORDER — FENTANYL CITRATE 0.05 MG/ML IJ SOLN: 50.0000 ug | INTRAMUSCULAR | Status: DC | PRN

## 2015-02-10 MED ORDER — MIDAZOLAM HCL 2 MG/2ML IJ SOLN
INTRAMUSCULAR | Status: AC
  Filled 2015-02-10: qty 2

## 2015-02-10 MED ORDER — PROPOFOL 10 MG/ML IV BOLUS
INTRAVENOUS | Status: DC | PRN
  Administered 2015-02-10: 300 mg via INTRAVENOUS

## 2015-02-10 MED ORDER — LACTATED RINGERS IV SOLN
INTRAVENOUS | Status: DC
  Administered 2015-02-10: 09:00:00 via INTRAVENOUS

## 2015-02-10 MED ORDER — DEXAMETHASONE SODIUM PHOSPHATE 4 MG/ML IJ SOLN
INTRAMUSCULAR | Status: DC | PRN
  Administered 2015-02-10: 10 mg via INTRAVENOUS

## 2015-02-10 MED ORDER — HYDROMORPHONE HCL 1 MG/ML IJ SOLN: 0.2500 mg | INTRAMUSCULAR | Status: DC | PRN

## 2015-02-10 MED ORDER — CEFAZOLIN SODIUM-DEXTROSE 2-3 GM-% IV SOLR
INTRAVENOUS | Status: AC
  Filled 2015-02-10: qty 50

## 2015-02-10 MED ORDER — LIDOCAINE HCL (CARDIAC) 20 MG/ML IV SOLN
INTRAVENOUS | Status: DC | PRN
  Administered 2015-02-10: 50 mg via INTRAVENOUS

## 2015-02-10 MED ORDER — CEFAZOLIN SODIUM-DEXTROSE 2-3 GM-% IV SOLR: 2.0000 g | INTRAVENOUS | Status: DC

## 2015-02-10 MED ORDER — SCOPOLAMINE 1 MG/3DAYS TD PT72
1.0000 | MEDICATED_PATCH | TRANSDERMAL | Status: DC
  Administered 2015-02-10: 1 via TRANSDERMAL
  Administered 2015-02-10: 1.5 mg via TRANSDERMAL

## 2015-02-10 MED ORDER — ONDANSETRON HCL 4 MG/2ML IJ SOLN
INTRAMUSCULAR | Status: DC | PRN
  Administered 2015-02-10: 4 mg via INTRAVENOUS

## 2015-02-10 MED ORDER — FENTANYL CITRATE 0.05 MG/ML IJ SOLN
INTRAMUSCULAR | Status: AC
  Filled 2015-02-10: qty 6

## 2015-02-10 MED ORDER — MIDAZOLAM HCL 2 MG/2ML IJ SOLN
1.0000 mg | INTRAMUSCULAR | Status: DC | PRN
  Administered 2015-02-10: 2 mg via INTRAVENOUS

## 2015-02-10 MED ORDER — SCOPOLAMINE 1 MG/3DAYS TD PT72
MEDICATED_PATCH | TRANSDERMAL | Status: AC
  Filled 2015-02-10: qty 1

## 2015-02-10 MED ORDER — FENTANYL CITRATE 0.05 MG/ML IJ SOLN
INTRAMUSCULAR | Status: DC | PRN
  Administered 2015-02-10: 100 ug via INTRAVENOUS

## 2015-02-10 MED ORDER — OXYCODONE HCL 5 MG PO TABS: 5.0000 mg | ORAL_TABLET | Freq: Once | ORAL | Status: DC | PRN

## 2015-02-10 MED ORDER — OXYCODONE HCL 5 MG/5ML PO SOLN: 5.0000 mg | Freq: Once | ORAL | Status: DC | PRN

## 2015-02-10 MED ORDER — ONDANSETRON HCL 4 MG/2ML IJ SOLN: 4.0000 mg | Freq: Once | INTRAMUSCULAR | Status: DC | PRN

## 2015-02-10 SURGICAL SUPPLY — 33 items
BLADE SURG 15 STRL LF DISP TIS (BLADE) ×1 IMPLANT
BLADE SURG 15 STRL SS (BLADE) ×3
CANISTER SUCT 1200ML W/VALVE (MISCELLANEOUS) ×3 IMPLANT
COVER MAYO STAND STRL (DRAPES) IMPLANT
DECANTER SPIKE VIAL GLASS SM (MISCELLANEOUS) ×3 IMPLANT
ELECT COATED BLADE 2.86 ST (ELECTRODE) IMPLANT
ELECT REM PT RETURN 9FT ADLT (ELECTROSURGICAL)
ELECTRODE REM PT RTRN 9FT ADLT (ELECTROSURGICAL) IMPLANT
GLOVE BIO SURGEON STRL SZ 6.5 (GLOVE) ×2 IMPLANT
GLOVE BIO SURGEONS STRL SZ 6.5 (GLOVE) ×1
GLOVE BIOGEL M STRL SZ7.5 (GLOVE) ×2 IMPLANT
GLOVE BIOGEL PI IND STRL 8 (GLOVE) IMPLANT
GLOVE BIOGEL PI INDICATOR 8 (GLOVE) ×2
GOWN STRL REUS W/ TWL LRG LVL3 (GOWN DISPOSABLE) ×2 IMPLANT
GOWN STRL REUS W/ TWL XL LVL3 (GOWN DISPOSABLE) IMPLANT
GOWN STRL REUS W/TWL LRG LVL3 (GOWN DISPOSABLE) ×6
GOWN STRL REUS W/TWL XL LVL3 (GOWN DISPOSABLE) ×3
MARKER SKIN DUAL TIP RULER LAB (MISCELLANEOUS) IMPLANT
NDL PRECISIONGLIDE 27X1.5 (NEEDLE) ×1 IMPLANT
NEEDLE PRECISIONGLIDE 27X1.5 (NEEDLE) ×3 IMPLANT
PACK BASIN DAY SURGERY FS (CUSTOM PROCEDURE TRAY) ×3 IMPLANT
PENCIL FOOT CONTROL (ELECTRODE) IMPLANT
SCISSORS WIRE ANG 4 3/4 DISP (INSTRUMENTS) IMPLANT
SHEET MEDIUM DRAPE 40X70 STRL (DRAPES) ×3 IMPLANT
SPONGE GAUZE 4X4 12PLY STER LF (GAUZE/BANDAGES/DRESSINGS) ×6 IMPLANT
SUT CHROMIC 3 0 PS 2 (SUTURE) IMPLANT
SUT CHROMIC 4 0 PS 2 18 (SUTURE) IMPLANT
SYR CONTROL 10ML LL (SYRINGE) ×3 IMPLANT
TOWEL OR 17X24 6PK STRL BLUE (TOWEL DISPOSABLE) ×3 IMPLANT
TRAY DSU PREP LF (CUSTOM PROCEDURE TRAY) IMPLANT
TUBE CONNECTING 20'X1/4 (TUBING) ×1
TUBE CONNECTING 20X1/4 (TUBING) ×2 IMPLANT
YANKAUER SUCT BULB TIP NO VENT (SUCTIONS) ×3 IMPLANT

## 2015-02-10 NOTE — Transfer of Care (Signed)
Immediate Anesthesia Transfer of Care Note  Patient: Alan PiggJeffrey W Cavallero  Procedure(s) Performed: Procedure(s): REMOVAL OF MAXILLARY MANDIBULAR HARDWARE  (N/A)  Patient Location: PACU  Anesthesia Type:General  Level of Consciousness: sedated  Airway & Oxygen Therapy: Patient Spontanous Breathing and Patient connected to face mask oxygen  Post-op Assessment: Report given to RN and Post -op Vital signs reviewed and stable  Post vital signs: Reviewed and stable  Last Vitals:  Filed Vitals:   02/10/15 0925  BP: 105/66  Pulse: 72  Temp: 36.7 C  Resp: 20    Complications: No apparent anesthesia complications

## 2015-02-10 NOTE — Anesthesia Procedure Notes (Signed)
Procedure Name: LMA Insertion Date/Time: 02/10/2015 10:10 AM Performed by: Zenia ResidesPAYNE, [place] D Pre-anesthesia Checklist: Patient identified, Emergency Drugs available, Suction available and Patient being monitored Patient Re-evaluated:Patient Re-evaluated prior to inductionOxygen Delivery Method: Circle System Utilized Preoxygenation: Pre-oxygenation with 100% oxygen Intubation Type: IV induction Ventilation: Mask ventilation without difficulty LMA: LMA inserted LMA Size: 5.0 Number of attempts: 1 Airway Equipment and Method: Bite block Placement Confirmation: positive ETCO2 Tube secured with: Tape Dental Injury: Teeth and Oropharynx as per pre-operative assessment

## 2015-02-10 NOTE — H&P (Signed)
Alan Estrada is an 54 y.o. male.   Chief Complaint: facial fractures HPI: The patient is a 54 yrs old wm here for removal of his MMF hardware.  He was originally seen in the ED with his wife. 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  He underwent MMF placement and has been doing well.     Past Medical History  Diagnosis Date  . Arthritis   . Palpitations   . Heart palpitations Oct. 2014    evaluated by [facility] -benign- no treatment needed  . GERD (gastroesophageal reflux disease)     prn OTC med   . PONV (postoperative nausea and vomiting)     Past Surgical History  Procedure Laterality Date  . Shoulder arthroscopy  03/2010    right  . Cystectomy  03/2010    from inside right shoulder  . Pilonidal cyst excision  1984  . Pilonidal cyst excision  09/03/2011    Dr Jamey RipaStreck  . Carpal tunnel release Bilateral 2013    surgery 6 wks apart  . Elbow arthroscopy Right 2014    ulnar nerve decompression  . Orif facial fracture Bilateral 12/30/2014    Procedure: OPEN REDUCTION INTERNAL FIXATION (ORIF) BILATERAL FACIAL FRACTURE;  Surgeon: Wayland Denislaire Sanger, DO;  Location: [facility];  Service: Plastics;  Laterality: Bilateral;    History reviewed. No pertinent family history. Social History:  reports that he has never smoked. He has never used smokeless tobacco. He reports that he drinks alcohol. He reports that he does not use illicit drugs.  Allergies: No Known Allergies  No prescriptions prior to admission    No results found for this or any previous visit (from the past 48 hour(s)). No results found.  Review of Systems  Constitutional: Negative.   HENT: Negative.   Eyes: Negative.   Respiratory: Negative.   Cardiovascular: Negative.   Gastrointestinal: Negative.   Genitourinary: Negative.   Musculoskeletal: Negative.   Skin: Negative.   Neurological: Negative.   Psychiatric/Behavioral:  Negative.     Height 6\' 3"  (1.905 m), weight 99.791 kg (220 lb). Physical Exam  Constitutional: He is oriented to person, place, and time. He appears well-developed and well-nourished.  HENT:  Head: Normocephalic and atraumatic.  Eyes: Conjunctivae and EOM are normal. Pupils are equal, round, and reactive to light.  Cardiovascular: Normal rate.   Respiratory: Effort normal.  Neurological: He is alert and oriented to person, place, and time.  Psychiatric: He has a normal mood and affect. His behavior is normal. Judgment and thought content normal.     Assessment/Plan Here for removal of MMF.  SANGER,CLAIRE 02/10/2015, 7:24 AM

## 2015-02-10 NOTE — Op Note (Signed)
Operative Note   DATE OF OPERATION: 02/10/2015  [facility]ATION[facility]  SURGICAL DIVISION: Plastic Surgery  PREOPERATIVE DIAGNOSES:  Facial fractures with placement of MMF  POSTOPERATIVE DIAGNOSES:  same  PROCEDURE:  Removal of MMF  SURGEON: Wayland Denislaire Sanger, DO  ASSISTANT: Shawn Rayburn, PA  ANESTHESIA:  General.   COMPLICATIONS: None.   INDICATIONS FOR PROCEDURE:  The patient, Alan Estrada is a 54 y.o. male born on [birth year], is here for treatment of facial fractures. MRN: [id number]  CONSENT:  Informed consent was obtained directly from the patient. Risks, benefits and alternatives were fully discussed. Specific risks including but not limited to bleeding, infection, hematoma, seroma, scarring, pain, infection, contracture, asymmetry, wound healing problems, and need for further surgery were all discussed. The patient did have an ample opportunity to have questions answered to satisfaction.   DESCRIPTION OF PROCEDURE:  The patient was taken to the operating room.  The patient's operative site was prepped and draped in a sterile fashion. A time out was performed and all information was confirmed to be correct.  General anesthesia was administered.  The screws were removal and the hardware. The patient tolerated the procedure well.  There were no complications. The patient was allowed to wake from anesthesia, extubated and taken to the recovery room in satisfactory condition.

## 2015-02-10 NOTE — Anesthesia Postprocedure Evaluation (Signed)
  Anesthesia Post-op Note  Patient: Alan Estrada  Procedure(s) Performed: Procedure(s): REMOVAL OF MAXILLARY MANDIBULAR HARDWARE  (N/A)  Patient Location: PACU  Anesthesia Type: General   Level of Consciousness: awake, alert  and oriented  Airway and Oxygen Therapy: Patient Spontanous Breathing  Post-op Pain: none  Post-op Assessment: Post-op Vital signs reviewed  Post-op Vital Signs: Reviewed  Last Vitals:  Filed Vitals:   02/10/15 1145  BP: 119/72  Pulse: 62  Temp: 36.6 C  Resp: 18    Complications: No apparent anesthesia complications

## 2015-02-10 NOTE — Anesthesia Preprocedure Evaluation (Signed)
Anesthesia Evaluation  Patient identified by MRN, date of birth, ID band Patient awake    Reviewed: Allergy & Precautions, NPO status , Patient's Chart, lab work & pertinent test results  Airway Mallampati: I  TM Distance: >3 FB   Mouth opening: Limited Mouth Opening  Dental  (+) Teeth Intact, Dental Advisory Given   Pulmonary  breath sounds clear to auscultation        Cardiovascular Rhythm:Regular Rate:Normal     Neuro/Psych    GI/Hepatic GERD-  Medicated and Controlled,  Endo/Other    Renal/GU      Musculoskeletal   Abdominal   Peds  Hematology   Anesthesia Other Findings   Reproductive/Obstetrics                             Anesthesia Physical Anesthesia Plan  ASA: II  Anesthesia Plan: General   Post-op Pain Management:    Induction: Intravenous  Airway Management Planned: Mask  Additional Equipment:   Intra-op Plan:   Post-operative Plan: Extubation in OR  Informed Consent: I have reviewed the patients History and Physical, chart, labs and discussed the procedure including the risks, benefits and alternatives for the proposed anesthesia with the patient or authorized representative who has indicated his/her understanding and acceptance.   Dental advisory given  Plan Discussed with: CRNA, Anesthesiologist and Surgeon  Anesthesia Plan Comments:         Anesthesia Quick Evaluation

## 2015-02-10 NOTE — Brief Op Note (Signed)
02/10/2015  10:19 AM  PATIENT:  Josepha PiggJeffrey W Kott  54 y.o. male  PRE-OPERATIVE DIAGNOSIS:  CLOSED MANDIBULA FACIAL FRACTURE  POST-OPERATIVE DIAGNOSIS:  CLOSED MANDIBULA FACIAL FRACTURE  PROCEDURE:  Procedure(s): REMOVAL OF MAXILLARY MANDIBULAR HARDWARE  (N/A)  SURGEON:  Surgeon(s) and Role:    * Kleigh Hoelzer Sanger, DO - Primary  PHYSICIAN ASSISTANT: Shawn Rayburn, PA  ASSISTANTS: none   ANESTHESIA:   general  EBL:  Total I/O In: 400 [I.V.:400] Out: -   BLOOD ADMINISTERED:none  DRAINS: none   LOCAL MEDICATIONS USED:  NONE  SPECIMEN:  No Specimen  DISPOSITION OF SPECIMEN:  N/A  COUNTS:  YES  TOURNIQUET:  * No tourniquets in log *  DICTATION: .Dragon Dictation  PLAN OF CARE: Discharge to home after PACU  PATIENT DISPOSITION:  PACU - hemodynamically stable.   Delay start of Pharmacological VTE agent (>24hrs) due to surgical blood loss or risk of bleeding: no

## 2015-02-10 NOTE — Discharge Instructions (Signed)
Soft diet Oral hygiene   Post Anesthesia Home Care Instructions  Activity: Get plenty of rest for the remainder of the day. A responsible adult should stay with you for 24 hours following the procedure.  For the next 24 hours, DO NOT: -Drive a car -Advertising copywriterperate machinery -Drink alcoholic beverages -Take any medication unless instructed by your physician -Make any legal decisions or sign important papers.  Meals: Start with liquid foods such as gelatin or soup. Progress to regular foods as tolerated. Avoid greasy, spicy, heavy foods. If nausea and/or vomiting occur, drink only clear liquids until the nausea and/or vomiting subsides. Call your physician if vomiting continues.  Special Instructions/Symptoms: Your throat may feel dry or sore from the anesthesia or the breathing tube placed in your throat during surgery. If this causes discomfort, gargle with warm salt water. The discomfort should disappear within 24 hours.

## 2015-02-10 NOTE — Interval H&P Note (Signed)
History and Physical Interval Note:  02/10/2015 9:51 AM  Alan PiggJeffrey W Estrada  has presented today for surgery, with the diagnosis of CLOSED MANDIBULA FACIAL FRACTURE  The various methods of treatment have been discussed with the patient and family. After consideration of risks, benefits and other options for treatment, the patient has consented to  Procedure(s): REMOVAL OF MAXILLARY MANDIBULAR HARDWARE  (N/A) as a surgical intervention .  The patient's history has been reviewed, patient examined, no change in status, stable for surgery.  I have reviewed the patient's chart and labs.  Questions were answered to the patient's satisfaction.     SANGER,CLAIRE

## 2015-02-15 ENCOUNTER — Encounter (HOSPITAL_BASED_OUTPATIENT_CLINIC_OR_DEPARTMENT_OTHER): Payer: Self-pay | Admitting: Plastic Surgery

## 2016-07-20 DIAGNOSIS — R002 Palpitations: Secondary | ICD-10-CM | POA: Insufficient documentation

## 2017-11-08 DIAGNOSIS — Z0000 Encounter for general adult medical examination without abnormal findings: Secondary | ICD-10-CM | POA: Diagnosis not present

## 2017-11-08 DIAGNOSIS — Z13220 Encounter for screening for lipoid disorders: Secondary | ICD-10-CM | POA: Diagnosis not present

## 2018-01-01 DIAGNOSIS — N390 Urinary tract infection, site not specified: Secondary | ICD-10-CM | POA: Diagnosis not present

## 2018-01-22 DIAGNOSIS — N3000 Acute cystitis without hematuria: Secondary | ICD-10-CM | POA: Diagnosis not present

## 2018-01-22 DIAGNOSIS — N486 Induration penis plastica: Secondary | ICD-10-CM | POA: Diagnosis not present

## 2018-03-17 DIAGNOSIS — M62830 Muscle spasm of back: Secondary | ICD-10-CM | POA: Diagnosis not present

## 2018-12-08 DIAGNOSIS — Z125 Encounter for screening for malignant neoplasm of prostate: Secondary | ICD-10-CM | POA: Diagnosis not present

## 2018-12-08 DIAGNOSIS — Z0000 Encounter for general adult medical examination without abnormal findings: Secondary | ICD-10-CM | POA: Diagnosis not present

## 2018-12-08 DIAGNOSIS — E663 Overweight: Secondary | ICD-10-CM | POA: Diagnosis not present

## 2018-12-08 DIAGNOSIS — Z13220 Encounter for screening for lipoid disorders: Secondary | ICD-10-CM | POA: Diagnosis not present

## 2019-02-09 ENCOUNTER — Other Ambulatory Visit: Payer: Self-pay

## 2019-02-09 ENCOUNTER — Emergency Department (HOSPITAL_COMMUNITY)
Admission: EM | Admit: 2019-02-09 | Discharge: 2019-02-09 | Disposition: A | Discharge status: Home / Self Care | Payer: 59 | Attending: Emergency Medicine | Admitting: Emergency Medicine

## 2019-02-09 ENCOUNTER — Encounter (HOSPITAL_COMMUNITY): Payer: Self-pay

## 2019-02-09 DIAGNOSIS — R591 Generalized enlarged lymph nodes: Secondary | ICD-10-CM | POA: Diagnosis not present

## 2019-02-09 DIAGNOSIS — M542 Cervicalgia: Secondary | ICD-10-CM | POA: Diagnosis not present

## 2019-02-09 DIAGNOSIS — R590 Localized enlarged lymph nodes: Secondary | ICD-10-CM | POA: Diagnosis not present

## 2019-02-09 DIAGNOSIS — Z79899 Other long term (current) drug therapy: Secondary | ICD-10-CM | POA: Insufficient documentation

## 2019-02-09 DIAGNOSIS — H9209 Otalgia, unspecified ear: Secondary | ICD-10-CM | POA: Diagnosis not present

## 2019-02-09 DIAGNOSIS — R221 Localized swelling, mass and lump, neck: Secondary | ICD-10-CM | POA: Diagnosis not present

## 2019-02-09 NOTE — Discharge Instructions (Signed)
Continue taking daily allergy medication. Start using Flonase daily to help with swelling. Use Tylenol and ibuprofen to help with pain and swelling.   Make sure you are staying well-hydrated water. Consider getting a humidifier or breathing and air from a hot shower at night to decrease dryness. Follow-up with your primary care doctor if your symptoms not improving. Return to the emergency room if you develop difficulty breathing, difficulty swallowing, or any new, worsening, concerning symptoms.

## 2019-02-09 NOTE — ED Triage Notes (Addendum)
Pt feels a lump in his throat since Saturday & feels that his throat becomes hard to swallow d/t swelling & he was using an outdoor cleaner chemical that he is concerned that he has inhaled from him not wearing a mask. He denies SOB, chest pain, pt is A/Ox 4.

## 2019-02-09 NOTE — ED Provider Notes (Signed)
[facility] EMERGENCY DEPARTMENT Provider Note   CSN: [id number] Arrival date & time: 02/09/19  0907    History   Chief Complaint Chief Complaint  Patient presents with  . Lump in throat    HPI Alan Estrada is a 58 y.o. male presenting for evaluation of L sided neck soreness and swelling.   Pt states for the past 2 days, he has been having discomfort of the left side of his neck.  Symptoms began several hours after using a bleach cleaning supply.  He reports pain and swelling of the left side of his neck, worse at night.  He describes feeling like his throat is very dry and there is a flap making it hurt and hard to breathe.  He has associated nasal congestion and postnasal drip.  He reports associated left-sided ear fullness.  He has a history of seasonal allergies, takes over-the-counter allergy medication daily.  He denies fevers, chills, difficulty swallowing, chest pain, shortness of breath, cough, nausea, vomiting, abdominal pain, urinary symptoms, normal bowel movements.  He denies sick contacts.  Reports no medical problems other than allergies.  Takes no other medications daily.  He tried Afrin last night which improved his nasal symptoms, but did not improve the feeling in his neck.  He has not tried anything else.     HPI  Past Medical History:  Diagnosis Date  . Arthritis   . GERD (gastroesophageal reflux disease)    prn OTC med   . Heart palpitations Oct. 2014   evaluated by [facility] -benign- no treatment needed  . Palpitations   . PONV (postoperative nausea and vomiting)     Patient Active Problem List   Diagnosis Date Noted  . LeFort II fracture with routine healing 12/30/2014  . Fracture of maxilla (HCC) 12/28/2014  . Extensive facial fractures (HCC) 12/24/2014  . Closed fracture of upper jaw bone (HCC) 12/24/2014    Past Surgical History:  Procedure Laterality Date  . CARPAL TUNNEL RELEASE Bilateral 2013   surgery 6 wks apart   . CYSTECTOMY  03/2010   from inside right shoulder  . ELBOW ARTHROSCOPY Right 2014   ulnar nerve decompression  . MANDIBULAR HARDWARE REMOVAL N/A 02/10/2015   Procedure: REMOVAL OF MAXILLARY MANDIBULAR HARDWARE ;  Surgeon: Wayland Denis, DO;  Location: [facility];  Service: Plastics;  Laterality: N/A;  . ORIF FACIAL FRACTURE Bilateral 12/30/2014   Procedure: OPEN REDUCTION INTERNAL FIXATION (ORIF) BILATERAL FACIAL FRACTURE;  Surgeon: Wayland Denis, DO;  Location: [facility];  Service: Plastics;  Laterality: Bilateral;  . PILONIDAL CYST EXCISION  1984  . PILONIDAL CYST EXCISION  09/03/2011   Dr Jamey Ripa  . SHOULDER ARTHROSCOPY  03/2010   right        Home Medications    Prior to Admission medications   Medication Sig Start Date End Date Taking? Authorizing Provider  Chlorpheniramine Maleate (ALLERGY PO) Take 1 tablet by mouth daily.    [provider]  DiphenhydrAMINE HCl (ALLERGY MED PO) Take 1 tablet by mouth daily.    [provider]  docusate sodium (COLACE) 100 MG capsule Take 1 capsule (100 mg total) by mouth 2 (two) times daily. 12/26/14   Nonie Hoyer, PA-C  HYDROcodone-acetaminophen (NORCO) 5-325 MG per tablet Take 1 tablet by mouth every 6 (six) hours as needed for moderate pain. 02/10/15   Rayburn, Fanny Bien, PA-C  Multiple Vitamin (MULTIVITAMIN) capsule Take 1 capsule by mouth daily.  [provider]  polyethylene glycol (MIRALAX / GLYCOLAX) packet Take 17 g by mouth daily. 12/30/14   Rayburn, Fanny Bien, PA-C    Family History History reviewed. No pertinent family history.  Social History Social History   Tobacco Use  . Smoking status: Never Smoker  . Smokeless tobacco: Never Used  Substance Use Topics  . Alcohol use: Yes    Comment: occasional-beer  . Drug use: No     Allergies   Patient has no known allergies.   Review of Systems Review of Systems  HENT: Positive for congestion and  ear pain.   Musculoskeletal: Positive for neck pain.  All other systems reviewed and are negative.    Physical Exam Updated Vital Signs BP (!) 147/89   Pulse 72   Temp 98.3 F (36.8 C) (Oral)   Resp 18   Ht  (1.905 m)   Wt 111.1 kg   SpO2 96%   BMI 30.62 kg/m   Physical Exam Vitals signs and nursing note reviewed.  Constitutional:      General: He is not in acute distress.    Appearance: He is well-developed.     Comments: Appears nontoxic  HENT:     Head: Normocephalic and atraumatic.     Comments: OP mildly erythematous without tonsillar swelling or exudate. uvula midline with equal palate rise. TMs with fluid, but not infection. no muffled voice. No trismus. Airway intact Eyes:     Extraocular Movements: Extraocular movements intact.     Conjunctiva/sclera: Conjunctivae normal.     Pupils: Pupils are equal, round, and reactive to light.  Neck:     Musculoskeletal: Normal range of motion and neck supple.     Comments: L sided cervical LAD Cardiovascular:     Rate and Rhythm: Normal rate and regular rhythm.     Pulses: Normal pulses.  Pulmonary:     Effort: Pulmonary effort is normal. No respiratory distress.     Breath sounds: Normal breath sounds. No wheezing.     Comments: Speaking in full sentences. Clear lung sounds in all fields.  Abdominal:     General: There is no distension.     Palpations: Abdomen is soft. There is no mass.     Tenderness: There is no abdominal tenderness. There is no guarding or rebound.  Musculoskeletal: Normal range of motion.  Lymphadenopathy:     Cervical: Cervical adenopathy present.  Skin:    General: Skin is warm and dry.     Capillary Refill: Capillary refill takes less than 2 seconds.  Neurological:     Mental Status: He is alert and oriented to person, place, and time.      ED Treatments / Results  Labs (all labs ordered are listed, but only abnormal results are displayed) Labs Reviewed - No data to display   EKG None  Radiology No results found.  Procedures Procedures (including critical care time)  Medications Ordered in ED Medications - No data to display   Initial Impression / Assessment and Plan / ED Course  I have reviewed the triage vital signs and the nursing notes.  Pertinent labs & imaging results that were available during my care of the patient were reviewed by me and considered in my medical decision making (see chart for details).        Pt presenting for evaluation of feeling of swelling and pain in his left side neck.  Physical exam reassuring, he appears nontoxic.  Oral exam not consistent with  peritonsillar abscess.  Symptoms not consistent with anaphylaxis, in fact low suspicion that symptoms are caused by exposure to bleach.  Favor allergies versus URI as cause of nasal congestion, ear fullness, and left-sided LAD.  Symptoms not consistent with COVID.  Gust findings and plan with patient.  Discussed continued symptomatic treatment at home, and follow-up with PCP as needed.  At this time, patient appears safe for discharge.  Return precautions given.  Patient states he understands and agrees to plan.  Final Clinical Impressions(s) / ED Diagnoses   Final diagnoses:  Lymphadenopathy    ED Discharge Orders    None       Alveria Apley, PA-C 02/09/19 1245    Eber Hong, MD 02/10/19 [phone]

## 2021-01-26 ENCOUNTER — Other Ambulatory Visit: Payer: Self-pay | Admitting: Orthopedic Surgery

## 2021-01-26 DIAGNOSIS — G8929 Other chronic pain: Secondary | ICD-10-CM

## 2021-01-29 DIAGNOSIS — I10 Essential (primary) hypertension: Secondary | ICD-10-CM | POA: Insufficient documentation

## 2021-01-29 DIAGNOSIS — E7800 Pure hypercholesterolemia, unspecified: Secondary | ICD-10-CM | POA: Insufficient documentation

## 2021-02-05 ENCOUNTER — Other Ambulatory Visit: Payer: Self-pay

## 2021-02-05 ENCOUNTER — Ambulatory Visit (HOSPITAL_COMMUNITY)
Admission: RE | Admit: 2021-02-05 | Discharge: 2021-02-05 | Disposition: A | Discharge status: Home / Self Care | Payer: 59 | Source: Ambulatory Visit | Attending: Orthopedic Surgery | Admitting: Orthopedic Surgery

## 2021-02-05 DIAGNOSIS — M25561 Pain in right knee: Secondary | ICD-10-CM | POA: Insufficient documentation

## 2021-02-05 DIAGNOSIS — G8929 Other chronic pain: Secondary | ICD-10-CM | POA: Diagnosis present

## 2021-03-20 ENCOUNTER — Other Ambulatory Visit: Payer: Self-pay

## 2021-03-20 ENCOUNTER — Encounter
Admission: RE | Admit: 2021-03-20 | Discharge: 2021-03-20 | Disposition: A | Discharge status: Home / Self Care | Payer: 59 | Source: Ambulatory Visit | Attending: Orthopedic Surgery | Admitting: Orthopedic Surgery

## 2021-03-20 DIAGNOSIS — Z01812 Encounter for preprocedural laboratory examination: Secondary | ICD-10-CM | POA: Diagnosis present

## 2021-03-20 DIAGNOSIS — I10 Essential (primary) hypertension: Secondary | ICD-10-CM | POA: Diagnosis not present

## 2021-03-20 HISTORY — DX: Essential (primary) hypertension: I10

## 2021-03-20 NOTE — Patient Instructions (Signed)
Your procedure is scheduled on:03-29-21 WEDNESDAY Report to the Registration Desk on the 1st floor of the Medical Mall-Then proceed to the 2nd floor Surgery Desk in the Medical Mall To find out your arrival time, please call [phone] between 1PM - 3PM on:03-28-21 TUESDAY  REMEMBER: Instructions that are not followed completely may result in serious medical risk, up to and including death; or upon the discretion of your surgeon and anesthesiologist your surgery may need to be rescheduled.  Do not eat food after midnight the night before surgery.  No gum chewing, lozengers or hard candies.  You may however, drink CLEAR liquids up to 2 hours before you are scheduled to arrive for your surgery. Do not drink anything within 2 hours of your scheduled arrival time.  Clear liquids include: - water  - apple juice without pulp - gatorade - black coffee or tea (Do NOT add milk or creamers to the coffee or tea) Do NOT drink anything that is not on this list.  In addition, your doctor has ordered for you to drink the provided  Ensure Pre-Surgery Clear Carbohydrate Drink  Drinking this carbohydrate drink up to two hours before surgery helps to reduce insulin resistance and improve patient outcomes. Please complete drinking 2 hours prior to scheduled arrival time.  TAKE THESE MEDICATIONS THE MORNING OF SURGERY WITH A SIP OF WATER: -ZYRTEC (CETRIZINE)  One week prior to surgery: Stop Anti-inflammatories (NSAIDS) such as Advil, Aleve, Ibuprofen, Motrin, Naproxen, Naprosyn and Aspirin based products such as Excedrin, Goodys Powder, BC Powder-OK TO TAKE TYLENOL IF NEEDED  Stop ANY OVER THE COUNTER supplements until after surgeryy-LAST DOSE OF MULTIVITAMIN ON 03-22-21-YOU MAY CONTINUE AFTER SURGERY  No Alcohol for 24 hours before or after surgery.  No Smoking including e-cigarettes for 24 hours prior to surgery.  No chewable tobacco products for at least 6 hours prior to surgery.  No nicotine  patches on the day of surgery.  Do not use any "recreational" drugs for at least a week prior to your surgery.  Please be advised that the combination of cocaine and anesthesia may have negative outcomes, up to and including death. If you test positive for cocaine, your surgery will be cancelled.  On the morning of surgery brush your teeth with toothpaste and water, you may rinse your mouth with mouthwash if you wish. Do not swallow any toothpaste or mouthwash.  Do not wear jewelry, make-up, hairpins, clips or nail polish.  Do not wear lotions, powders, or perfumes.   Do not shave body from the neck down 48 hours prior to surgery just in case you cut yourself which could leave a site for infection.  Also, freshly shaved skin may become irritated if using the CHG soap.  Contact lenses, hearing aids and dentures may not be worn into surgery.  Do not bring valuables to the hospital. [facility] is not responsible for any missing/lost belongings or valuables.   Use CHG Soap as directed on instruction sheet.  Notify your doctor if there is any change in your medical condition (cold, fever, infection).  Wear comfortable clothing (specific to your surgery type) to the hospital.  Plan for stool softeners for home use; pain medications have a tendency to cause constipation. You can also help prevent constipation by eating foods high in fiber such as fruits and vegetables and drinking plenty of fluids as your diet allows.  After surgery, you can help prevent lung complications by doing breathing exercises.  Take deep breaths and cough  every 1-2 hours. Your doctor may order a device called an Incentive Spirometer to help you take deep breaths. When coughing or sneezing, hold a pillow firmly against your incision with both hands. This is called "splinting." Doing this helps protect your incision. It also decreases belly discomfort.  If you are being admitted to the hospital overnight, leave your  suitcase in the car. After surgery it may be brought to your room.  If you are being discharged the day of surgery, you will not be allowed to drive home. You will need a responsible adult (18 years or older) to drive you home and stay with you that night.   If you are taking public transportation, you will need to have a responsible adult (18 years or older) with you. Please confirm with your physician that it is acceptable to use public transportation.   Please call the Pre-admissions Testing Dept. at [phone] if you have any questions about these instructions.  Surgery Visitation Policy:  Patients undergoing a surgery or procedure may have one family member or support person with them as long as that person is not COVID-19 positive or experiencing its symptoms.  That person may remain in the waiting area during the procedure.  Inpatient Visitation:    Visiting hours are 7 a.m. to 8 p.m. Inpatients will be allowed two visitors daily. The visitors may change each day during the patient's stay. No visitors under the age of 8. Any visitor under the age of 49 must be accompanied by an adult. The visitor must pass COVID-19 screenings, use hand sanitizer when entering and exiting the patient's room and wear a mask at all times, including in the patient's room. Patients must also wear a mask when staff or their visitor are in the room. Masking is required regardless of vaccination status.

## 2021-03-21 ENCOUNTER — Encounter
Admission: RE | Admit: 2021-03-21 | Discharge: 2021-03-21 | Disposition: A | Discharge status: Home / Self Care | Payer: 59 | Source: Ambulatory Visit | Attending: Orthopedic Surgery | Admitting: Orthopedic Surgery

## 2021-03-21 DIAGNOSIS — Z01818 Encounter for other preprocedural examination: Secondary | ICD-10-CM | POA: Insufficient documentation

## 2021-03-21 DIAGNOSIS — I10 Essential (primary) hypertension: Secondary | ICD-10-CM | POA: Insufficient documentation

## 2021-03-21 DIAGNOSIS — Z01812 Encounter for preprocedural laboratory examination: Secondary | ICD-10-CM | POA: Diagnosis not present

## 2021-03-26 NOTE — H&P (Signed)
ORTHOPAEDIC HISTORY & PHYSICAL Alan, Estrada, [place] - 03/21/2021 2:45 PM EDT Formatting of this note is different from the original. [facility] Chief Complaint:   Chief Complaint  Patient presents with  . Knee Pain  H & P RIGHT KNEE   History of Present Illness:   Alan Estrada is a 60 y.o. male that presents to clinic today for his preoperative history and evaluation. Patient presents unaccompanied. The patient is scheduled to undergo a right knee arthroscopy on 03/29/21 by Dr. Ernest Pine. His pain began several years ago. The pain is located primarily along the medial and lateral aspects of the knee. He describes his pain as worse with lateral movements, pivoting, and rising after sitting or squatting. He reports associated swelling with some giving way of the knee. He denies associated numbness or tingling, denies locking.   The patient's symptoms have progressed to the point that they decrease his quality of life. The patient has previously undergone conservative treatment including NSAIDS and activity modification without adequate control of his symptoms.  Past Medical, Surgical, Family, Social History, Allergies, Medications:   Past Medical History:  Past Medical History:  Diagnosis Date  . History of chicken pox  . Hypertension  . Seasonal allergies   Past Surgical History: History reviewed. No pertinent surgical history.  Current Medications:  Current Outpatient Medications  Medication Sig Dispense Refill  . calcium carbonate (CALCIUM CARBONATE) 300 mg (750 mg) chewable tablet Take 600 mg of elemental by mouth once daily as needed for Heartburn  . cetirizine (ZYRTEC) 10 MG tablet Take 10 mg by mouth every morning  . food supplemt, lactose-reduced (ENSURE ACTIVE HIGH PROTEIN) Liqd Take 1 Bottle by mouth as directed  . ibuprofen (MOTRIN) 200 MG tablet Take 600 mg by mouth as needed  . methyl salicylate-menthol  (SALONPAS,M.SALICYLATE-MENTHOL,) 10-3 % PtMd Apply topically  . multivit with min-folic acid (MULTIVITAMIN GUMMIES) 200 mcg Chew Take 2 tablets by mouth once daily  . valsartan (DIOVAN) 80 MG tablet Take 1 tablet by mouth once daily   No current facility-administered medications for this visit.   Allergies: No Known Allergies  Social History:  Social History   Socioeconomic History  . Marital status: Married  Spouse name: Amy  . Number of children: 2  . Years of education: 83  . Highest education level: High school graduate  Occupational History  . Occupation: Full-time-Office and Outside  Comment: [employer]  Tobacco Use  . Smoking status: Never Smoker  . Smokeless tobacco: Never Used  Vaping Use  . Vaping Use: Never used  Substance and Sexual Activity  . Alcohol use: Yes  Alcohol/week: 6.0 standard drinks  Types: 6 Cans of beer per week  . Drug use: Never  . Sexual activity: Yes  Partners: Female   Family History:  Family History  Problem Relation Age of Onset  . High blood pressure (Hypertension) Mother  . High blood pressure (Hypertension) Brother   Review of Systems:   A 10+ ROS was performed, reviewed, and the pertinent orthopaedic findings are documented in the HPI.   Physical Examination:   BP 126/84 (BP Location: Left upper arm, Patient Position: Sitting, BP Cuff Size: Adult)  Ht 190.5 cm (6\' 3" )  Wt (!) 113.4 kg (250 lb)  BMI 31.25 kg/m   Patient is a well-developed, well-nourished male in no acute distress. Patient has normal mood and affect. Patient is alert and oriented to person, place, and time.  HEENT: Atraumatic, normocephalic. Pupils equal and reactive to light. Extraocular motion intact. Noninjected sclera.  Cardiovascular: Regular rate and rhythm, with no murmurs, rubs, or gallops. Distal pulses palpable. No bruits.  Respiratory: Lungs clear to auscultation bilaterally.   Right Knee:  Soft tissue swelling:  mild Effusion: minimal Erythema: none Crepitance: none Tenderness: medial, lateral Alignment: normal Mediolateral laxity: stable Anterior drawer test:negative Lachman`s test: negative McMurray`s test: positive Atrophy: No significant atrophy.  Quadriceps tone was fair to good. Range of Motion: Greater than 120 degrees  Sensation intact over the saphenous, lateral sural cutaneous, superficial fibular, and deep fibular nerve distributions.  Tests Performed/Reviewed:   MRI: I reviewed the right knee MRI from [facility] dated 02/05/2021. I concur with the radiologist's interpretation as below:  MRI OF THE RIGHT KNEE WITHOUT CONTRAST   TECHNIQUE:  Multiplanar, multisequence MR imaging of the knee was performed. No  intravenous contrast was administered.   COMPARISON: None.   FINDINGS:  MENISCI   Medial meniscus: Large radial tear of the posterior horn medial  meniscus near the meniscal root, with phantom meniscus sign.  Adjacent grade 3 horizontal signal in the remaining posterior horn  and extending into the midbody.   Lateral meniscus: Considerable degenerative tearing of the anterior  horn of the lateral meniscus with vertical component and superior  surface component, extending into the midbody. Amorphous increased  signal in the posterior horn lateral meniscus near the meniscal root  for example on image 17 of series 9 compatible with advance meniscal  degeneration.   LIGAMENTS   Cruciates: Mildly accentuated signal in the mid and distal PCL  compatible with PCL degeneration. Similarly there is expansion of  mildly increased signal in theanterior cruciate ligament favoring  degeneration but without tear.   Collaterals: Unremarkable   CARTILAGE   Patellofemoral: Mild to moderate degenerative chondral thinning with  mild marginal spurring.   Medial: Mild degenerative chondral thinning.   Lateral: 0.7 by 1.1 cm sagittally oriented  partial-thickness  chondral defect posteriorly in the lateral femoral condyle as on  images 9 through 10 of series 8 and on image 19 series 9. Otherwise  mild to moderate degenerative chondral thinning with marginal  spurring.   Joint: Small knee effusion with suspected synovitis especially along  the posterior margin of Hoffa's fat pad. Thickened medial plica.   Popliteal Fossa: 4.9 by 1.2 by 3.1 cm fluid collection tracking  along the medial margin of the proximal lateral head gastrocnemius,  between the muscle and the adjacent popliteal vessels. This is a  mildly non characteristic location for a popliteal cyst and could  represent a ganglion cyst. This is higher in position than I would  expect inthe setting of ruptured plantaris.   Multilocular 2.5 by 1.7 by 1.3 cm cystic lesion along the proximal  tendon margin of the medial head gastrocnemius muscle suggesting  ganglion cyst. Extensor Mechanism: Is mild patellar tendinopathy.  Ossicle along the posterior margin of the distal patellar tendon  suggesting prior Osgood-Schlatter disease, without internal marrow  edema to suggest reactivation.   Bones: Geode along the tibial spine. Tibial spine spurring.   Other: No supplemental non-categorized findings.   IMPRESSION:  1. Large radial tear of the posterior horn medial meniscus near the  meniscal root, with adjacent grade 3 horizontal signal in the  remaining posterior horn and extending into the midbody.  2. Considerable degenerative tearing of the anterior horn of the  lateral meniscus with vertical component and superior surface  component, extending into the midbody. Amorphous increased signal in  the posterior horn lateral meniscus near the meniscal root  compatible with advance meniscal degeneration.  3. Mild PCL and ACL degeneration without tear.  4. Mild to moderate osteoarthritis. Partial-thickness chondral  defect posteriorly along the lateral femoral condyle.  5.  Small knee effusion with suspected synovitis especially along the  posterior margin of Hoffa's fat pad.  6. Multilocular cystic lesion along the proximal tendon margin of  the medial head gastrocnemius muscle suggesting ganglion cyst.  Another cystic lesion between the lateral head gastrocnemius and  popliteal vesselsproximally could represent ganglion cyst as well.  7. Thickened medial plica.  8. Mild patellar tendinopathy. Ossicle along the posterior margin of  the distal patellar tendon suggesting prior Osgood-Schlatter  disease, without internal marrow edema to suggest reactivation.   Electronically Signed  By: Gaylyn Rong M.D.  On: 02/06/2021 10:39  Impression:   ICD-10-CM  1. Internal derangement of right knee M23.91   Plan:   The patient has significant lateral and medial meniscal pathology of the right knee. Having failed conservative treatment, the patient has elected to proceed with a right knee arthroscopy. The risks of surgery, including blood clot and infection, were discussed with the patient. The possibility of continued pain after the procedure given the presence of degenerative changes to the cartilage in the lateral compartment were also discussed with the patient. The patient elects to proceed with surgery. The patient is instructed to stop all blood thinners prior to surgery. The patient is instructed to call the hospital the day before surgery to learn of the proper arrival time.   Contact our office with any questions or concerns. Follow up as indicated, or sooner should any new problems arise, if conditions worsen, or if they are otherwise concerned.   Michelene Gardener, PA -C [facility] [address] [place], [place] [address] Phone: [phone]  This note was generated in part with voice recognition software and I apologize for any typographical errors that were not detected and  corrected.  Electronically signed by Michelene Gardener, PA at 03/26/2021 1:55 PM EDT

## 2021-03-27 ENCOUNTER — Other Ambulatory Visit: Payer: 59

## 2021-03-28 MED ORDER — FAMOTIDINE 20 MG PO TABS: 20.0000 mg | ORAL_TABLET | Freq: Once | ORAL | Status: AC

## 2021-03-28 MED ORDER — LACTATED RINGERS IV SOLN: INTRAVENOUS | Status: DC

## 2021-03-28 MED ORDER — CELECOXIB 200 MG PO CAPS: 400.0000 mg | ORAL_CAPSULE | Freq: Once | ORAL | Status: AC

## 2021-03-28 MED ORDER — ORAL CARE MOUTH RINSE: 15.0000 mL | Freq: Once | OROMUCOSAL | Status: AC

## 2021-03-28 MED ORDER — CHLORHEXIDINE GLUCONATE 0.12 % MT SOLN: 15.0000 mL | Freq: Once | OROMUCOSAL | Status: AC

## 2021-03-29 ENCOUNTER — Ambulatory Visit: Payer: 59 | Admitting: Anesthesiology

## 2021-03-29 ENCOUNTER — Encounter: Payer: Self-pay | Admitting: Orthopedic Surgery

## 2021-03-29 ENCOUNTER — Other Ambulatory Visit: Payer: Self-pay

## 2021-03-29 ENCOUNTER — Ambulatory Visit
Admission: RE | Admit: 2021-03-29 | Discharge: 2021-03-29 | Disposition: A | Discharge status: Home / Self Care | Payer: 59 | Attending: Orthopedic Surgery | Admitting: Orthopedic Surgery

## 2021-03-29 ENCOUNTER — Encounter
Admission: RE | Disposition: A | Discharge status: Home / Self Care | Payer: Self-pay | Source: Home / Self Care | Attending: Orthopedic Surgery

## 2021-03-29 DIAGNOSIS — Z79899 Other long term (current) drug therapy: Secondary | ICD-10-CM | POA: Insufficient documentation

## 2021-03-29 DIAGNOSIS — M94261 Chondromalacia, right knee: Secondary | ICD-10-CM | POA: Diagnosis not present

## 2021-03-29 DIAGNOSIS — M23211 Derangement of anterior horn of medial meniscus due to old tear or injury, right knee: Secondary | ICD-10-CM | POA: Diagnosis not present

## 2021-03-29 DIAGNOSIS — M25461 Effusion, right knee: Secondary | ICD-10-CM | POA: Diagnosis not present

## 2021-03-29 DIAGNOSIS — Z9889 Other specified postprocedural states: Secondary | ICD-10-CM

## 2021-03-29 DIAGNOSIS — M23221 Derangement of posterior horn of medial meniscus due to old tear or injury, right knee: Secondary | ICD-10-CM | POA: Diagnosis present

## 2021-03-29 HISTORY — PX: KNEE ARTHROSCOPY: SHX127

## 2021-03-29 SURGERY — ARTHROSCOPY, KNEE
Anesthesia: General | Site: Knee | Laterality: Right

## 2021-03-29 MED ORDER — HYDROCODONE-ACETAMINOPHEN 5-325 MG PO TABS: 1.0000 | ORAL_TABLET | ORAL | Status: DC | PRN

## 2021-03-29 MED ORDER — MORPHINE SULFATE (PF) 4 MG/ML IV SOLN
INTRAVENOUS | Status: AC
  Filled 2021-03-29: qty 1

## 2021-03-29 MED ORDER — BUPIVACAINE-EPINEPHRINE 0.25% -1:200000 IJ SOLN
INTRAMUSCULAR | Status: DC | PRN
  Administered 2021-03-29: 5 mL

## 2021-03-29 MED ORDER — CELECOXIB 200 MG PO CAPS
ORAL_CAPSULE | ORAL | Status: AC
  Administered 2021-03-29: 400 mg via ORAL
  Filled 2021-03-29: qty 2

## 2021-03-29 MED ORDER — METOCLOPRAMIDE HCL 5 MG/ML IJ SOLN: 5.0000 mg | Freq: Three times a day (TID) | INTRAMUSCULAR | Status: DC | PRN

## 2021-03-29 MED ORDER — HYDROCODONE-ACETAMINOPHEN 5-325 MG PO TABS: 1.0000 | ORAL_TABLET | ORAL | 0 refills | Status: AC | PRN

## 2021-03-29 MED ORDER — FENTANYL CITRATE (PF) 100 MCG/2ML IJ SOLN
INTRAMUSCULAR | Status: DC | PRN
  Administered 2021-03-29 (×2): 50 ug via INTRAVENOUS

## 2021-03-29 MED ORDER — MIDAZOLAM HCL 2 MG/2ML IJ SOLN
INTRAMUSCULAR | Status: DC | PRN
  Administered 2021-03-29: 2 mg via INTRAVENOUS

## 2021-03-29 MED ORDER — ONDANSETRON HCL 4 MG/2ML IJ SOLN
INTRAMUSCULAR | Status: AC
  Filled 2021-03-29: qty 2

## 2021-03-29 MED ORDER — BUPIVACAINE-EPINEPHRINE (PF) 0.25% -1:200000 IJ SOLN
INTRAMUSCULAR | Status: AC
  Filled 2021-03-29: qty 30

## 2021-03-29 MED ORDER — METOCLOPRAMIDE HCL 10 MG PO TABS
5.0000 mg | ORAL_TABLET | Freq: Three times a day (TID) | ORAL | Status: DC | PRN
Start: 2021-03-29 — End: 2021-03-29

## 2021-03-29 MED ORDER — ACETAMINOPHEN 10 MG/ML IV SOLN
INTRAVENOUS | Status: DC | PRN
  Administered 2021-03-29: 1000 mg via INTRAVENOUS

## 2021-03-29 MED ORDER — MIDAZOLAM HCL 2 MG/2ML IJ SOLN
INTRAMUSCULAR | Status: AC
  Filled 2021-03-29: qty 2

## 2021-03-29 MED ORDER — LIDOCAINE HCL (CARDIAC) PF 100 MG/5ML IV SOSY
PREFILLED_SYRINGE | INTRAVENOUS | Status: DC | PRN
  Administered 2021-03-29: 100 mg via INTRAVENOUS

## 2021-03-29 MED ORDER — PROPOFOL 10 MG/ML IV BOLUS
INTRAVENOUS | Status: DC | PRN
  Administered 2021-03-29: 200 mg via INTRAVENOUS

## 2021-03-29 MED ORDER — FENTANYL CITRATE (PF) 100 MCG/2ML IJ SOLN
INTRAMUSCULAR | Status: AC
  Filled 2021-03-29: qty 2

## 2021-03-29 MED ORDER — FENTANYL CITRATE (PF) 100 MCG/2ML IJ SOLN
25.0000 ug | INTRAMUSCULAR | Status: DC | PRN
  Administered 2021-03-29: 25 ug via INTRAVENOUS

## 2021-03-29 MED ORDER — MORPHINE SULFATE 4 MG/ML IJ SOLN
INTRAMUSCULAR | Status: DC | PRN
  Administered 2021-03-29: 4 mg

## 2021-03-29 MED ORDER — ONDANSETRON HCL 4 MG/2ML IJ SOLN
4.0000 mg | Freq: Four times a day (QID) | INTRAMUSCULAR | Status: DC | PRN
  Administered 2021-03-29: 4 mg via INTRAVENOUS

## 2021-03-29 MED ORDER — DEXAMETHASONE SODIUM PHOSPHATE 10 MG/ML IJ SOLN
INTRAMUSCULAR | Status: DC | PRN
  Administered 2021-03-29: 10 mg via INTRAVENOUS

## 2021-03-29 MED ORDER — SODIUM CHLORIDE 0.9 % IV SOLN: INTRAVENOUS | Status: DC

## 2021-03-29 MED ORDER — PROPOFOL 10 MG/ML IV BOLUS
INTRAVENOUS | Status: AC
  Filled 2021-03-29: qty 20

## 2021-03-29 MED ORDER — EPHEDRINE SULFATE 50 MG/ML IJ SOLN
INTRAMUSCULAR | Status: DC | PRN
  Administered 2021-03-29: 10 mg via INTRAVENOUS

## 2021-03-29 MED ORDER — MORPHINE SULFATE (PF) 2 MG/ML IV SOLN: 0.5000 mg | INTRAVENOUS | Status: DC | PRN

## 2021-03-29 MED ORDER — FAMOTIDINE 20 MG PO TABS
ORAL_TABLET | ORAL | Status: AC
  Administered 2021-03-29: 20 mg via ORAL
  Filled 2021-03-29: qty 1

## 2021-03-29 MED ORDER — FENTANYL CITRATE (PF) 100 MCG/2ML IJ SOLN
INTRAMUSCULAR | Status: AC
  Administered 2021-03-29: 25 ug via INTRAVENOUS
  Filled 2021-03-29: qty 2

## 2021-03-29 MED ORDER — ACETAMINOPHEN 10 MG/ML IV SOLN
INTRAVENOUS | Status: AC
  Filled 2021-03-29: qty 100

## 2021-03-29 MED ORDER — HYDROMORPHONE HCL 1 MG/ML IJ SOLN
INTRAMUSCULAR | Status: AC
  Filled 2021-03-29: qty 1

## 2021-03-29 MED ORDER — ONDANSETRON HCL 4 MG PO TABS: 4.0000 mg | ORAL_TABLET | Freq: Four times a day (QID) | ORAL | Status: DC | PRN

## 2021-03-29 MED ORDER — CHLORHEXIDINE GLUCONATE 0.12 % MT SOLN
OROMUCOSAL | Status: AC
  Administered 2021-03-29: 15 mL via OROMUCOSAL
  Filled 2021-03-29: qty 15

## 2021-03-29 MED ORDER — ONDANSETRON HCL 4 MG/2ML IJ SOLN
INTRAMUSCULAR | Status: DC | PRN
  Administered 2021-03-29: 4 mg via INTRAVENOUS

## 2021-03-29 MED ORDER — HYDROCODONE-ACETAMINOPHEN 7.5-325 MG PO TABS
1.0000 | ORAL_TABLET | ORAL | Status: DC | PRN
Start: 2021-03-29 — End: 2021-03-29

## 2021-03-29 MED ORDER — PROMETHAZINE HCL 25 MG/ML IJ SOLN: 6.2500 mg | INTRAMUSCULAR | Status: DC | PRN

## 2021-03-29 MED ORDER — HYDROMORPHONE HCL 1 MG/ML IJ SOLN
INTRAMUSCULAR | Status: DC | PRN
  Administered 2021-03-29 (×2): .5 mg via INTRAVENOUS

## 2021-03-29 MED ORDER — ACETAMINOPHEN 325 MG PO TABS
325.0000 mg | ORAL_TABLET | Freq: Four times a day (QID) | ORAL | Status: DC | PRN
Start: 2021-03-30 — End: 2021-03-29

## 2021-03-29 SURGICAL SUPPLY — 30 items
ADAPTER IRRIG TUBE 2 SPIKE SOL (ADAPTER) ×4 IMPLANT
ADPR TBG 2 SPK PMP STRL ASCP (ADAPTER) ×2
BLADE SHAVER 4.5 DBL SERAT CV (CUTTER) IMPLANT
COVER WAND RF STERILE (DRAPES) ×2 IMPLANT
CUFF TOURN SGL QUICK 24 (TOURNIQUET CUFF)
CUFF TOURN SGL QUICK 30 (TOURNIQUET CUFF)
CUFF TRNQT CYL 24X4X16.5-23 (TOURNIQUET CUFF) IMPLANT
CUFF TRNQT CYL 30X4X21-28X (TOURNIQUET CUFF) IMPLANT
DRAPE ARTHRO LIMB 89X125 STRL (DRAPES) ×2 IMPLANT
DRSG DERMACEA 8X12 NADH (GAUZE/BANDAGES/DRESSINGS) ×2 IMPLANT
DURAPREP 26ML APPLICATOR (WOUND CARE) ×4 IMPLANT
GAUZE SPONGE 4X4 12PLY STRL (GAUZE/BANDAGES/DRESSINGS) ×2 IMPLANT
GLOVE SURG ENC TEXT LTX SZ7.5 (GLOVE) ×2 IMPLANT
GLOVE SURG UNDER LTX SZ8 (GLOVE) ×2 IMPLANT
GOWN STRL REUS W/ TWL LRG LVL3 (GOWN DISPOSABLE) ×2 IMPLANT
GOWN STRL REUS W/TWL LRG LVL3 (GOWN DISPOSABLE) ×4
IV LACTATED RINGER IRRG 3000ML (IV SOLUTION) ×12
IV LR IRRIG 3000ML ARTHROMATIC (IV SOLUTION) ×6 IMPLANT
KIT TURNOVER KIT A (KITS) ×2 IMPLANT
MANIFOLD NEPTUNE II (INSTRUMENTS) ×4 IMPLANT
PACK ARTHROSCOPY KNEE (MISCELLANEOUS) ×2 IMPLANT
SET TUBE SUCT SHAVER OUTFL 24K (TUBING) ×2 IMPLANT
SET TUBE TIP INTRA-ARTICULAR (MISCELLANEOUS) ×2 IMPLANT
SOL PREP PVP 2OZ (MISCELLANEOUS) ×2
SOLUTION PREP PVP 2OZ (MISCELLANEOUS) ×1 IMPLANT
SUT ETHILON 3-0 FS-10 30 BLK (SUTURE) ×2
SUTURE EHLN 3-0 FS-10 30 BLK (SUTURE) ×1 IMPLANT
TUBING ARTHRO INFLOW-ONLY STRL (TUBING) ×2 IMPLANT
WAND HAND CNTRL MULTIVAC 50 (MISCELLANEOUS) ×2 IMPLANT
WRAP KNEE W/COLD PACKS 25.5X14 (SOFTGOODS) ×2 IMPLANT

## 2021-03-29 NOTE — Anesthesia Preprocedure Evaluation (Signed)
Anesthesia Evaluation  Patient identified by MRN, date of birth, ID band Patient awake    Reviewed: Allergy & Precautions, H&P , NPO status , Patient's Chart, lab work & pertinent test results, reviewed documented beta blocker date and time   History of Anesthesia Complications Negative for: history of anesthetic complications  Airway Mallampati: II  TM Distance: >3 FB Neck ROM: full    Dental  (+) Caps, Dental Advidsory Given, Teeth Intact   Pulmonary neg pulmonary ROS,    Pulmonary exam normal breath sounds clear to auscultation       Cardiovascular Exercise Tolerance: Good hypertension, (-) angina(-) Past MI and (-) Cardiac Stents Normal cardiovascular exam+ dysrhythmias (palpitations) (-) Valvular Problems/Murmurs Rhythm:regular Rate:Normal     Neuro/Psych negative neurological ROS  negative psych ROS   GI/Hepatic Neg liver ROS, GERD  ,  Endo/Other  negative endocrine ROS  Renal/GU negative Renal ROS  negative genitourinary   Musculoskeletal   Abdominal   Peds  Hematology negative hematology ROS (+)   Anesthesia Other Findings Past Medical History: No date: Arthritis No date: GERD (gastroesophageal reflux disease)     Comment:  prn OTC med  Oct. 2014: Heart palpitations     Comment:  evaluated by [facility] -benign- no treatment               needed No date: Hypertension No date: Palpitations   Reproductive/Obstetrics negative OB ROS                             Anesthesia Physical Anesthesia Plan  ASA: II  Anesthesia Plan: General   Post-op Pain Management:    Induction: Intravenous  PONV Risk Score and Plan: 2 and Ondansetron, Dexamethasone, Midazolam, Promethazine and Treatment may vary due to age or medical condition  Airway Management Planned: LMA  Additional Equipment:   Intra-op Plan:   Post-operative Plan: Extubation in OR  Informed Consent: I have  reviewed the patients History and Physical, chart, labs and discussed the procedure including the risks, benefits and alternatives for the proposed anesthesia with the patient or authorized representative who has indicated his/her understanding and acceptance.     Dental Advisory Given  Plan Discussed with: Anesthesiologist, CRNA and Surgeon  Anesthesia Plan Comments:         Anesthesia Quick Evaluation

## 2021-03-29 NOTE — H&P (Signed)
The patient has been re-examined, and the chart reviewed, and there have been no interval changes to the documented history and physical.    The risks, benefits, and alternatives have been discussed at length. The patient expressed understanding of the risks benefits and agreed with plans for surgical intervention.  Alan Estrada P. Loralee Weitzman, Jr. M.D.    

## 2021-03-29 NOTE — Op Note (Signed)
OPERATIVE NOTE  DATE OF SURGERY:  03/29/2021  PATIENT NAME:  DUSTIN BUMBAUGH   DOB: 12-Apr-1961  MRN: [id number]   PRE-OPERATIVE DIAGNOSIS:  Internal derangement of the right knee   POST-OPERATIVE DIAGNOSIS:   Tear of the posterior horn of the medial meniscus, right knee Tear of the anterior and posterior horns of the lateral meniscus, right knee Grade II-III chondromalacia, tricompartmental  PROCEDURE:  Right knee arthroscopy, partial medial and lateral meniscectomies, and chondroplasty  SURGEON:  Jena Gauss., M.D.   ASSISTANT: none  ANESTHESIA: general  ESTIMATED BLOOD LOSS: Minimal  FLUIDS REPLACED: 1000 mL of crystalloid  TOURNIQUET TIME: Not used  INDICATIONS FOR SURGERY: KEALII THUESON is a 60 y.o. year old male who has been seen for complaints of right knee pain. MRI demonstrated findings consistent with meniscal pathology. After discussion of the risks and benefits of surgical intervention, the patient expressed understanding of the risks benefits and agree with plans for right knee arthroscopy.   PROCEDURE IN DETAIL: The patient was brought into the operating room and, after adequate general anesthesia was achieved, a tourniquet was applied to the right thigh and the leg was placed in the leg holder. All bony prominences were well padded. The patient's right knee was cleaned and prepped with alcohol and Duraprep and draped in the usual sterile fashion. A "timeout" was performed as per usual protocol. The anticipated portal sites were injected with 0.25% Marcaine with epinephrine. An anterolateral incision was made and a cannula was inserted. A moderate effusion was evacuated and the knee was distended with fluid using the pump. The scope was advanced down the medial gutter into the medial compartment. Under visualization with the scope, an anteromedial portal was created and a hooked probe was inserted. The medial meniscus was visualized and probed.  There was a complex  tear along the posterior horn of the medial meniscus extending to the root.  The tear was debrided using meniscal punches and a 4.5 mm shaver.  Final contouring was performed using a 50 degree ArthroCare wand.  The articular cartilage was visualized.  There was fraying with grade II-III chondromalacia involving both the medial femoral condyle and medial tibial plateau.  These areas were debrided and contoured using the ArthroCare wand.  The scope was then advanced into the intercondylar notch. The anterior cruciate ligament was visualized and probed and felt to be intact. The scope was removed from the lateral portal and reinserted via the anteromedial portal to better visualize the lateral compartment. The lateral meniscus was visualized and probed.  There was a complex tear involving the anterior horn of the lateral meniscus.  The tear was debrided using the 4.5 mm incisor shaver so as to allow for better visualization.  Additional degenerative tears were noted to the posterior horn of the lateral meniscus.  The tears were debrided using meniscal punches and the 4.5 mm incisor shaver.  Final contouring was performed using a 50 degree ArthroCare wand.  The remaining rim of meniscus was visualized and probed and found to be stable.  The articular cartilage of the lateral compartment was visualized.  Fraying and grade II-III chondromalacia was noted to both the lateral femoral condyle and lateral tibial plateau.  These areas were debrided and contoured using the ArthroCare wand.  Finally, the scope was advanced so as to visualize the patellofemoral articulation. Good patellar tracking was appreciated.  Grade II-III chondromalacia was noted to the patellofemoral articulation and these areas were also debrided and contoured using  the ArthroCare wand.  The knee was irrigated with copius amounts of fluid and suctioned dry. The anterolateral portal was re-approximated with #3-0 nylon. A combination of 0.25% Marcaine  with epinephrine and 4 mg of Morphine were injected via the scope. The scope was removed and the anteromedial portal was re-approximated with #3-0 nylon. A sterile dressing was applied followed by application of an ice wrap.  The patient tolerated the procedure well and was transported to the PACU in stable condition.  Jerine Surles P. Angie Fava., M.D.

## 2021-03-29 NOTE — Anesthesia Procedure Notes (Signed)
Procedure Name: LMA Insertion Date/Time: 03/29/2021 4:06 PM Performed by: Junious Silk, CRNA Pre-anesthesia Checklist: Patient identified, Patient being monitored, Timeout performed, Emergency Drugs available and Suction available Patient Re-evaluated:Patient Re-evaluated prior to induction Oxygen Delivery Method: Circle system utilized Preoxygenation: Pre-oxygenation with 100% oxygen Induction Type: IV induction Ventilation: Mask ventilation without difficulty LMA: LMA inserted LMA Size: 4.5 Tube type: Oral Number of attempts: 1 Placement Confirmation: positive ETCO2 and breath sounds checked- equal and bilateral Tube secured with: Tape Dental Injury: Teeth and Oropharynx as per pre-operative assessment

## 2021-03-29 NOTE — Transfer of Care (Signed)
Immediate Anesthesia Transfer of Care Note  Patient: Alan Estrada  Procedure(s) Performed: ARTHROSCOPY KNEE (Right Knee)  Patient Location: PACU  Anesthesia Type:General  Level of Consciousness: awake and sedated  Airway & Oxygen Therapy: Patient Spontanous Breathing and Patient connected to face mask oxygen  Post-op Assessment: Report given to RN and Post -op Vital signs reviewed and stable  Post vital signs: Reviewed and stable  Last Vitals:  Vitals Value Taken Time  BP 117/83 03/29/21 1731  Temp    Pulse 75 03/29/21 1736  Resp 9 03/29/21 1736  SpO2 97 % 03/29/21 1736  Vitals shown include unvalidated device data.  Last Pain:  Vitals:   03/29/21 1448  TempSrc: Temporal  PainSc: 2          Complications: No complications documented.

## 2021-03-29 NOTE — Discharge Instructions (Signed)
AMBULATORY SURGERY  °DISCHARGE INSTRUCTIONS ° ° °1) The drugs that you were given will stay in your system until tomorrow so for the next 24 hours you should not: ° °A) Drive an automobile °B) Make any legal decisions °C) Drink any alcoholic beverage ° ° °2) You may resume regular meals tomorrow.  Today it is better to start with liquids and gradually work up to solid foods. ° °You may eat anything you prefer, but it is better to start with liquids, then soup and crackers, and gradually work up to solid foods. ° ° °3) Please notify your doctor immediately if you have any unusual bleeding, trouble breathing, redness and pain at the surgery site, drainage, fever, or pain not relieved by medication. ° ° ° °4) Additional Instructions: ° ° ° ° ° ° ° °Please contact your physician with any problems or Same Day Surgery at [phone], Monday through Friday 6 am to 4 pm, or [place] [facility] [place] Main number at [phone]. °Instructions after Knee Arthroscopy  ° ° James P. Hooten, Jr., M.D.    ° Dept. of Orthopaedics & Sports Medicine ° [facility] ° [address] ° [place], [place]  [address] ° ° Phone: [phone]   Fax: [phone] ° ° °DIET: °• Drink plenty of non-alcoholic fluids & begin a light diet. °• Resume your normal diet the day after surgery. ° °ACTIVITY:  °• You may use crutches or a walker with weight-bearing as tolerated, unless instructed otherwise. °• You may wean yourself off of the walker or crutches as tolerated.  °• Begin doing gentle exercises. Exercising will reduce the pain and swelling, increase motion, and prevent muscle weakness.   °• Avoid strenuous activities or athletics for a minimum of 4-6 weeks after arthroscopic surgery. °• Do not drive or operate any equipment until instructed. ° °WOUND CARE:  °• Place one to two pillows under the knee the first day or two when sitting or lying.  °• Continue to use the ice packs periodically to reduce pain and swelling. °• The small incisions  in your knee are closed with nylon stitches. The stitches will be removed in the office. °• The bulky dressing may be removed on the second day after surgery. DO NOT TOUCH THE STITCHES. Put a Band-Aid over each stitch. Do NOT use any ointments or creams on the incisions.  °• You may bathe or shower after the stitches are removed at the first office visit following surgery. ° °MEDICATIONS: °• You may resume your regular medications. °• Please take the pain medication as prescribed. °• Do not take pain medication on an empty stomach. °• Do not drive or drink alcoholic beverages when taking pain medications. ° °CALL THE OFFICE FOR: °• Temperature above 101 degrees °• Excessive bleeding or drainage on the dressing. °• Excessive swelling, coldness, or paleness of the toes. °• Persistent nausea and vomiting. ° °FOLLOW-UP:  °• You should have an appointment to return to the office in 7-10 days after surgery.  ° °  ° ° °[facility] °Department Directory °     ° ° ° [email or web address] ° ° °  ° ° °[email or web address] °  ° °      °Cardiology ° °Appointments: °[place] - [phone] °[place] - [phone]  Endocrinology ° °Appointments: °[place] - [phone] °[place] - [phone]  Gastroenterology ° °Appointments: °[place] - [phone] °[place] - [phone]  °      °General Surgery ° ° °Appointments: °[place] - [phone]  Internal Medicine/Family Medicine ° °Appointments: °[place] -   [phone] °[place] - [phone] °[place] - [phone]  Metabolic and Weigh Loss Surgery ° °Appointments: °[place] - [phone]  °      °Neurology ° °Appointments: °[place] - [phone] °[place] - [phone]  Neurosurgery ° °Appointments: °[place] - [phone]  Obstetrics & Gynecology ° °Appointments: °[place] - [phone] °[place] - [phone]  °      °Pediatrics ° °Appointments: °[place] - [phone] °[place] - [phone]  Physiatry ° °Appointments: °[place]  -[phone]  Physical Therapy ° °Appointments: °[place] - [phone] °[place] - [phone]  °      °Podiatry ° °Appointments: °[place] - [phone] °[place] - [phone]  Pulmonology ° °Appointments: °[place] - [phone]  Rheumatology ° °Appointments: °[place] - [phone]  °      °[place] Location: °[facility]  °[address] °[place], [place]  [address]  [place] Location: °[facility] °[address] °[place], [place]  [address]  [place] Location: °[facility] °[address] °[place], [place]  [address]  °  °AMBULATORY SURGERY  °DISCHARGE INSTRUCTIONS ° ° °5) The drugs that you were given will stay in your system until tomorrow so for the next 24 hours you should not: ° °D) Drive an automobile °E) Make any legal decisions °F) Drink any alcoholic beverage ° ° °6) You may resume regular meals tomorrow.  Today it is better to start with liquids and gradually work up to solid foods. ° °You may eat anything you prefer, but it is better to start with liquids, then soup and crackers, and gradually work up to solid foods. ° ° °7) Please notify your doctor immediately if you have any unusual bleeding, trouble breathing, redness and pain at the surgery site, drainage, fever, or pain not relieved by medication. ° ° ° °8) Additional Instructions: ° ° ° ° ° ° ° °Please contact your physician with any problems or Same Day Surgery at [phone], Monday through Friday 6 am to 4 pm, or [place] [facility] [place] Main number at [phone]. °

## 2021-03-30 ENCOUNTER — Encounter: Payer: Self-pay | Admitting: Orthopedic Surgery

## 2021-04-03 NOTE — Anesthesia Postprocedure Evaluation (Signed)
Anesthesia Post Note  Patient: Alan Estrada  Procedure(s) Performed: ARTHROSCOPY KNEE (Right Knee)  Patient location during evaluation: PACU Anesthesia Type: General Level of consciousness: awake and alert and oriented Pain management: pain level controlled Vital Signs Assessment: post-procedure vital signs reviewed and stable Respiratory status: spontaneous breathing Cardiovascular status: blood pressure returned to baseline Anesthetic complications: no   No complications documented.   Last Vitals:  Vitals:   03/29/21 1815 03/29/21 1823  BP: 131/75 125/82  Pulse: 66 62  Resp: 13 13  Temp: (!) 36.2 C (!) 36.1 C  SpO2: 98% 99%    Last Pain:  Vitals:   03/30/21 0922  TempSrc:   PainSc: 4                  Ariatna Jester

## 2022-12-30 IMAGING — MR MR KNEE*R* W/O CM
6 of 10 series · 25 of 40 positions shown · non-contrast
Comparison: None.

CLINICAL DATA: Right knee pain

EXAM:
MRI OF THE RIGHT KNEE WITHOUT CONTRAST
TECHNIQUE: Multiplanar, multisequence MR imaging of the knee was performed. No
intravenous contrast was administered.

[Series 5: T2 fat-sat · axial · right · 4.0mm · 0.53mm/px · z∈[-51,+92]mm · 5 of 34 slices shown (1 of 3)]
[im 1/34]
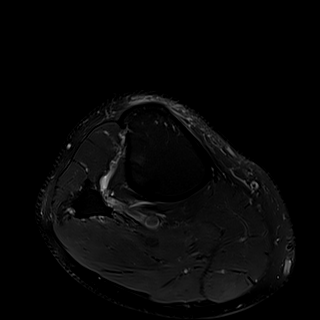
[im 9/34]
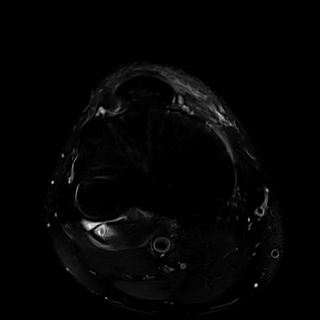
[im 17/34]
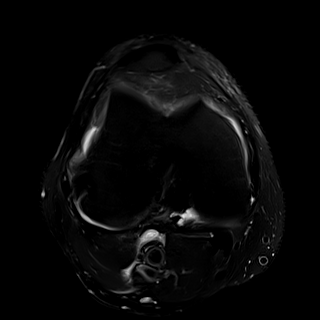
[im 25/34]
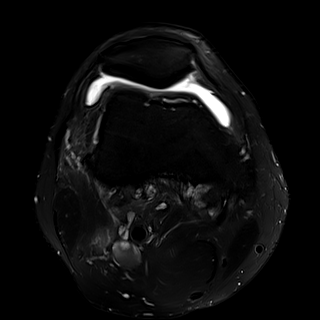
[im 34/34]
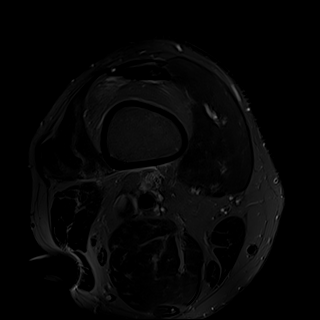

[Series 6: T1 · coronal · right · 4.0mm · 0.33mm/px · 4 of 29 slices shown]
[im 1/29]
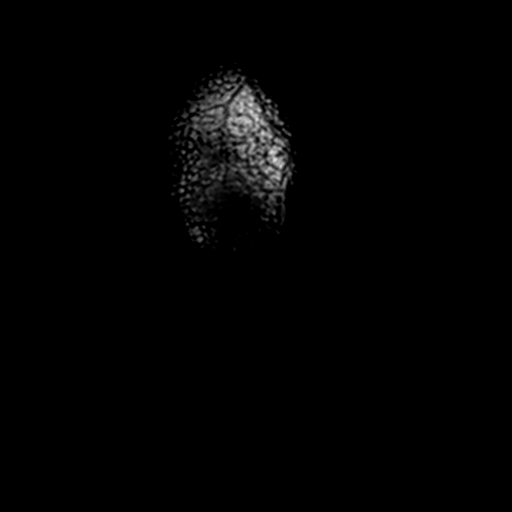
[im 10/29]
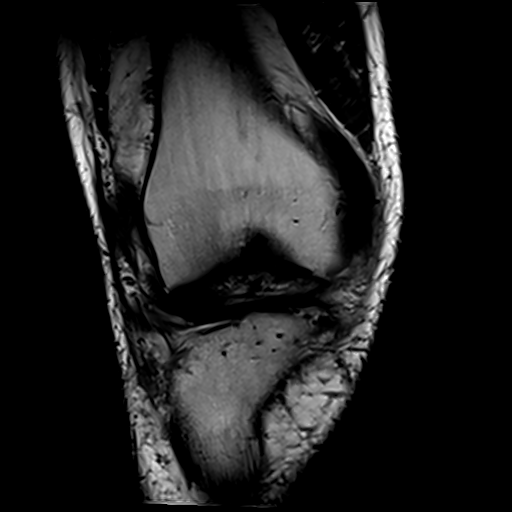
[im 19/29]
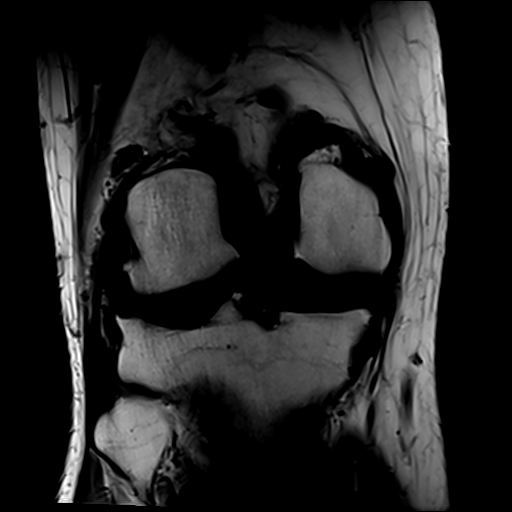
[im 29/29]
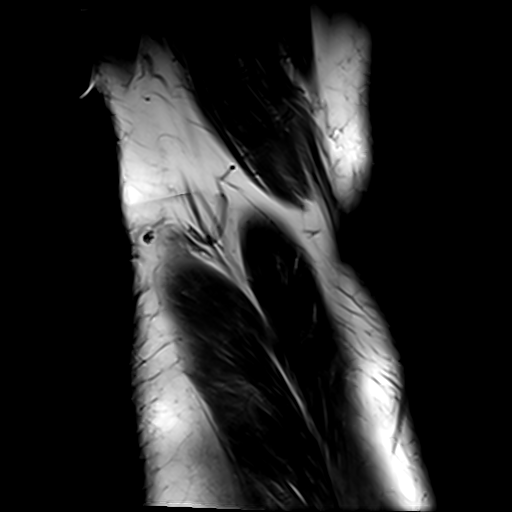

[Series 7: T2 fat-sat · coronal · right · 4.0mm · 0.66mm/px · 4 of 29 slices shown (2 of 3)]
[im 1/29]
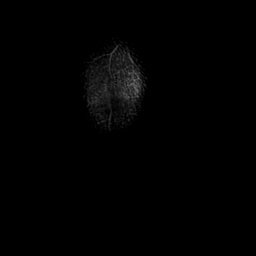
[im 10/29]
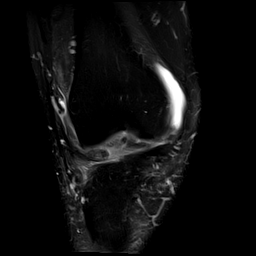
[im 19/29]
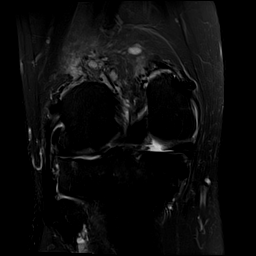
[im 29/29]
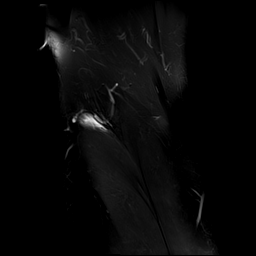

[Series 8: PD fat-sat · coronal · right · 4.0mm · 0.53mm/px · 4 of 29 slices shown (1 of 2)]
[im 1/29]
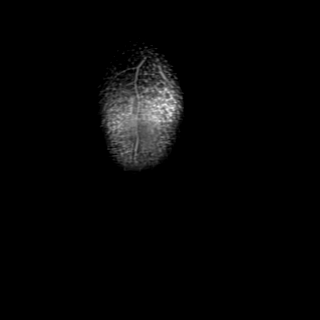
[im 10/29]
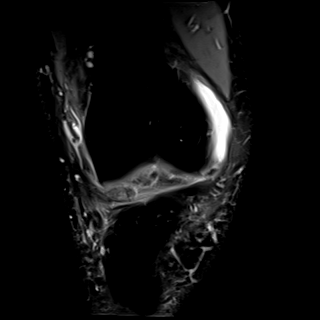
[im 19/29]
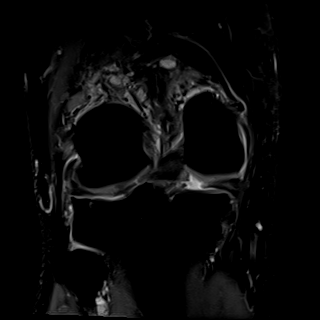
[im 29/29]
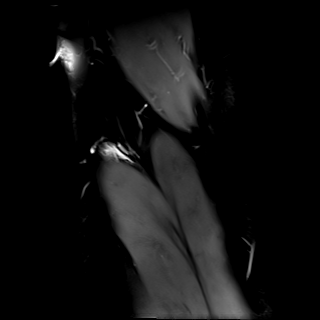

[Series 9: PD fat-sat · sagittal · right · 4.0mm · 0.53mm/px · 4 of 26 slices shown (2 of 2)]
[im 1/26]
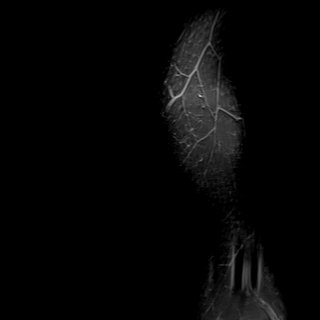
[im 9/26]
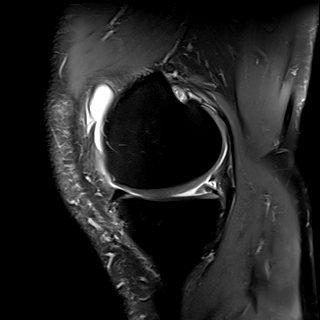
[im 17/26]
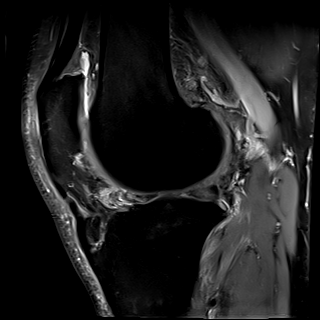
[im 26/26]
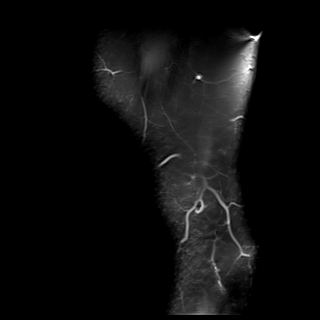

[Series 10: T2 fat-sat · sagittal · right · 4.0mm · 0.47mm/px · 4 of 26 slices shown (3 of 3)]
[im 1/26]
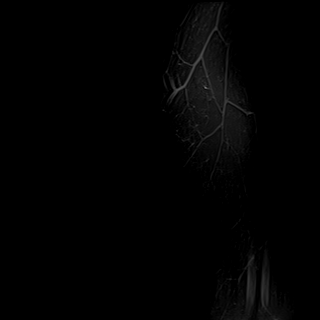
[im 9/26]
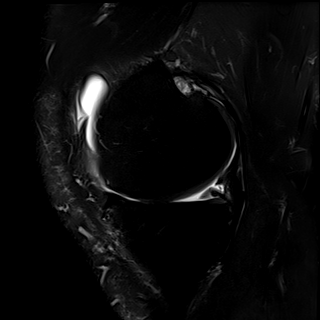
[im 17/26]
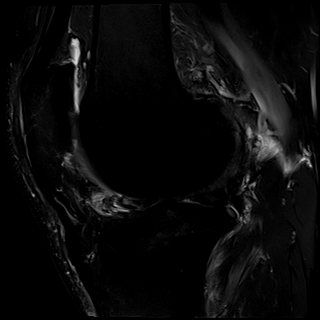
[im 26/26]
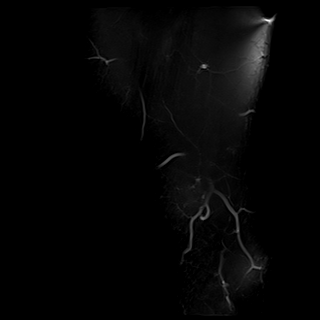

[25 of 40 positions shown; findings below may reference images not displayed]

FINDINGS: MENISCI

Medial meniscus: Large radial tear of the posterior horn medial
meniscus near the meniscal root, with phantom meniscus sign.
Adjacent grade 3 horizontal signal in the remaining posterior horn
and extending into the midbody.

Lateral meniscus: Considerable degenerative tearing of the anterior
horn of the lateral meniscus with vertical component and superior
surface component, extending into the midbody. Amorphous increased
signal in the posterior horn lateral meniscus near the meniscal root
for example on image 17 of series 9 compatible with advance meniscal
degeneration.

LIGAMENTS

Cruciates: Mildly accentuated signal in the mid and distal PCL
compatible with PCL degeneration. Similarly there is expansion of
mildly increased signal in the anterior cruciate ligament favoring
degeneration but without tear.

Collaterals:  Unremarkable

CARTILAGE

Patellofemoral: Mild to moderate degenerative chondral thinning with
mild marginal spurring.

Medial:  Mild degenerative chondral thinning.

Lateral: 0.7 by 1.1 cm sagittally oriented partial-thickness
chondral defect posteriorly in the lateral femoral condyle as on
images 9 through 10 of series 8 and on image 19 series 9. Otherwise
mild to moderate degenerative chondral thinning with marginal
spurring.

Joint: Small knee effusion with suspected synovitis especially along
the posterior margin of Hoffa's fat pad. Thickened medial plica.

Popliteal Fossa: 4.9 by 1.2 by 3.1 cm fluid collection tracking
along the medial margin of the proximal lateral head gastrocnemius,
between the muscle and the adjacent popliteal vessels. This is a
mildly non characteristic location for a popliteal cyst and could
represent a ganglion cyst. This is higher in position than I would
expect in the setting of ruptured plantaris.

Multilocular 2.5 by 1.7 by 1.3 cm cystic lesion along the proximal
tendon margin of the medial head gastrocnemius muscle suggesting
ganglion cyst. Extensor Mechanism: Is mild patellar tendinopathy.
Ossicle along the posterior margin of the distal patellar tendon
suggesting prior Osgood-Schlatter disease, without internal marrow
edema to suggest reactivation.

Bones:  Geode along the tibial spine.  Tibial spine spurring.

Other: No supplemental non-categorized findings.
IMPRESSION: 1. Large radial tear of the posterior horn medial meniscus near the
meniscal root, with adjacent grade 3 horizontal signal in the
remaining posterior horn and extending into the midbody.
2. Considerable degenerative tearing of the anterior horn of the
lateral meniscus with vertical component and superior surface
component, extending into the midbody. Amorphous increased signal in
the posterior horn lateral meniscus near the meniscal root
compatible with advance meniscal degeneration.
3. Mild PCL and ACL degeneration without tear.
4. Mild to moderate osteoarthritis. Partial-thickness chondral
defect posteriorly along the lateral femoral condyle.
5. Small knee effusion with suspected synovitis especially along the
posterior margin of Hoffa's fat pad.
6. Multilocular cystic lesion along the proximal tendon margin of
the medial head gastrocnemius muscle suggesting ganglion cyst.
Another cystic lesion between the lateral head gastrocnemius and
popliteal vessels proximally could represent ganglion cyst as well.
7. Thickened medial plica.
8. Mild patellar tendinopathy. Ossicle along the posterior margin of
the distal patellar tendon suggesting prior Osgood-Schlatter
disease, without internal marrow edema to suggest reactivation.
